# Patient Record
Sex: Male | Born: 1960 | Race: White | Hispanic: No | Marital: Married | Smoking: Former smoker
Health system: Southern US, Community
[De-identification: ages and names within clinical notes are randomized; demographics above are authoritative.]

## PROBLEM LIST (undated history)

## (undated) DIAGNOSIS — I10 Essential (primary) hypertension: Secondary | ICD-10-CM

## (undated) DIAGNOSIS — E119 Type 2 diabetes mellitus without complications: Secondary | ICD-10-CM

## (undated) DIAGNOSIS — E785 Hyperlipidemia, unspecified: Secondary | ICD-10-CM

## (undated) HISTORY — PX: HAND SURGERY: SHX662

## (undated) HISTORY — DX: Hyperlipidemia, unspecified: E78.5

---

## 1996-09-15 HISTORY — PX: HAND SURGERY: SHX662

## 2006-12-18 ENCOUNTER — Emergency Department (HOSPITAL_COMMUNITY): Admission: EM | Admit: 2006-12-18 | Discharge: 2006-12-18 | Payer: Self-pay | Admitting: Family Medicine

## 2013-06-13 ENCOUNTER — Encounter: Payer: Self-pay | Admitting: Family Medicine

## 2013-06-13 ENCOUNTER — Ambulatory Visit (INDEPENDENT_AMBULATORY_CARE_PROVIDER_SITE_OTHER): Payer: BC Managed Care – PPO | Admitting: Family Medicine

## 2013-06-13 VITALS — BP 127/87 | HR 66 | Temp 97.6°F | Ht 69.0 in | Wt 206.0 lb

## 2013-06-13 DIAGNOSIS — Z0000 Encounter for general adult medical examination without abnormal findings: Secondary | ICD-10-CM

## 2013-06-13 DIAGNOSIS — K5900 Constipation, unspecified: Secondary | ICD-10-CM

## 2013-06-13 DIAGNOSIS — R339 Retention of urine, unspecified: Secondary | ICD-10-CM

## 2013-06-13 DIAGNOSIS — N529 Male erectile dysfunction, unspecified: Secondary | ICD-10-CM

## 2013-06-13 LAB — POCT CBC
Granulocyte percent: 70.1 %G (ref 37–80)
HCT, POC: 49.9 % (ref 43.5–53.7)
Hemoglobin: 16.3 g/dL (ref 14.1–18.1)
Lymph, poc: 1.9 (ref 0.6–3.4)
MCH, POC: 28.6 pg (ref 27–31.2)
MCHC: 32.7 g/dL (ref 31.8–35.4)
MCV: 87.4 fL (ref 80–97)
MPV: 8.9 fL (ref 0–99.8)
POC Granulocyte: 5.7 (ref 2–6.9)
POC LYMPH PERCENT: 24 %L (ref 10–50)
Platelet Count, POC: 173 10*3/uL (ref 142–424)
RBC: 5.7 M/uL (ref 4.69–6.13)
RDW, POC: 13 %
WBC: 8.1 10*3/uL (ref 4.6–10.2)

## 2013-06-13 LAB — POCT UA - MICROSCOPIC ONLY
Casts, Ur, LPF, POC: NEGATIVE
Crystals, Ur, HPF, POC: NEGATIVE
Yeast, UA: NEGATIVE

## 2013-06-13 LAB — POCT URINALYSIS DIPSTICK
Bilirubin, UA: NEGATIVE
Glucose, UA: NEGATIVE
Ketones, UA: NEGATIVE
Leukocytes, UA: NEGATIVE
Nitrite, UA: NEGATIVE
Spec Grav, UA: >=1.03
Urobilinogen, UA: NEGATIVE
pH, UA: 5

## 2013-06-13 MED ORDER — TAMSULOSIN HCL 0.4 MG PO CAPS: 0.4000 mg | ORAL_CAPSULE | Freq: Every day | ORAL | Status: DC

## 2013-06-13 MED ORDER — POLYETHYLENE GLYCOL 3350 17 GM/SCOOP PO POWD: 17.0000 g | Freq: Two times a day (BID) | ORAL | Status: DC | PRN

## 2013-06-13 MED ORDER — TADALAFIL 20 MG PO TABS: 20.0000 mg | ORAL_TABLET | ORAL | Status: DC | PRN

## 2013-06-13 NOTE — Progress Notes (Signed)
  Subjective:    Patient ID: Currie Dennin, male    DOB: May 01, 1961, 52 y.o.   MRN: [id number]  HPI This 52 y.o. male presents for evaluation of having some bowel changes and constipation. He has been noticing the past few months he has to strain.  He has had to use stool Softeners now.  He has been having problems with diminished stream and difficulty getting Started with his urinary flow.  He has been experiencing some ED sx's.  He has not had CPE or labs in over 2 years.  He has not had colonoscopy.  He does not want flu shot.   Review of Systems C/o constipation, and urinary problems. No chest pain, SOB, HA, dizziness, vision change, N/V, diarrhea, myalgias, arthralgias or rash.     Objective:   Physical Exam Vital signs noted  Well developed well nourished male.  HEENT - Head atraumatic Normocephalic                Eyes - PERRLA, Conjuctiva - clear Sclera- Clear EOMI                Ears - EAC's Wnl TM's Wnl Gross Hearing WNL                Nose - Nares patent                 Throat - oropharanx wnl Respiratory - Lungs CTA bilateral Cardiac - RRR S1 and S2 without murmur GI - Abdomen soft Nontender and bowel sounds active x 4 Rectal - Rectal tone normal, prostate enlarged, smooth, and without masses, hemocult negative. Extremities - No edema. Neuro - Grossly intact.       Assessment & Plan:  Urinary retention - Plan: POCT UA - Microscopic Only, POCT urinalysis dipstick, tamsulosin (FLOMAX) 0.4 MG CAPS capsule  Routine general medical examination at a health care facility - Plan: POCT CBC, CMP14+EGFR, Lipid panel, PSA, total and free, Thyroid Panel With TSH, Ambulatory referral to Gastroenterology  Unspecified constipation - Plan: polyethylene glycol powder (GLYCOLAX/MIRALAX) powder, Ambulatory referral to Gastroenterology  Erectile dysfunction - Plan: tadalafil (CIALIS) 20 MG tablet.  Deatra Canter FNP

## 2013-06-13 NOTE — Patient Instructions (Addendum)
Smoking Cessation Quitting smoking is important to your health and has many advantages. However, it is not always easy to quit since nicotine is a very addictive drug. Often times, people try 3 times or more before being able to quit. This document explains the best ways for you to prepare to quit smoking. Quitting takes hard work and a lot of effort, but you can do it. ADVANTAGES OF QUITTING SMOKING  You will live longer, feel better, and live better.  Your body will feel the impact of quitting smoking almost immediately.  Within 20 minutes, blood pressure decreases. Your pulse returns to its normal level.  After 8 hours, carbon monoxide levels in the blood return to normal. Your oxygen level increases.  After 24 hours, the chance of having a heart attack starts to decrease. Your breath, hair, and body stop smelling like smoke.  After 48 hours, damaged nerve endings begin to recover. Your sense of taste and smell improve.  After 72 hours, the body is virtually free of nicotine. Your bronchial tubes relax and breathing becomes easier.  After 2 to 12 weeks, lungs can hold more air. Exercise becomes easier and circulation improves.  The risk of having a heart attack, stroke, cancer, or lung disease is greatly reduced.  After 1 year, the risk of coronary heart disease is cut in half.  After 5 years, the risk of stroke falls to the same as a nonsmoker.  After 10 years, the risk of lung cancer is cut in half and the risk of other cancers decreases significantly.  After 15 years, the risk of coronary heart disease drops, usually to the level of a nonsmoker.  If you are pregnant, quitting smoking will improve your chances of having a healthy baby.  The people you live with, especially any children, will be healthier.  You will have extra money to spend on things other than cigarettes. QUESTIONS TO THINK ABOUT BEFORE ATTEMPTING TO QUIT You may want to talk about your answers with your  caregiver.  Why do you want to quit?  If you tried to quit in the past, what helped and what did not?  What will be the most difficult situations for you after you quit? How will you plan to handle them?  Who can help you through the tough times? Your family? Friends? A caregiver?  What pleasures do you get from smoking? What ways can you still get pleasure if you quit? Here are some questions to ask your caregiver:  How can you help me to be successful at quitting?  What medicine do you think would be best for me and how should I take it?  What should I do if I need more help?  What is smoking withdrawal like? How can I get information on withdrawal? GET READY  Set a quit date.  Change your environment by getting rid of all cigarettes, ashtrays, matches, and lighters in your home, car, or work. Do not let people smoke in your home.  Review your past attempts to quit. Think about what worked and what did not. GET SUPPORT AND ENCOURAGEMENT You have a better chance of being successful if you have help. You can get support in many ways.  Tell your family, friends, and co-workers that you are going to quit and need their support. Ask them not to smoke around you.  Get individual, group, or telephone counseling and support. Programs are available at local hospitals and health centers. Call your local health department for   information about programs in your area.  Spiritual beliefs and practices may help some smokers quit.  Download a "quit meter" on your computer to keep track of quit statistics, such as how long you have gone without smoking, cigarettes not smoked, and money saved.  Get a self-help book about quitting smoking and staying off of tobacco. LEARN NEW SKILLS AND BEHAVIORS  Distract yourself from urges to smoke. Talk to someone, go for a walk, or occupy your time with a task.  Change your normal routine. Take a different route to work. Drink tea instead of coffee.  Eat breakfast in a different place.  Reduce your stress. Take a hot bath, exercise, or read a book.  Plan something enjoyable to do every day. Reward yourself for not smoking.  Explore interactive web-based programs that specialize in helping you quit. GET MEDICINE AND USE IT CORRECTLY Medicines can help you stop smoking and decrease the urge to smoke. Combining medicine with the above behavioral methods and support can greatly increase your chances of successfully quitting smoking.  Nicotine replacement therapy helps deliver nicotine to your body without the negative effects and risks of smoking. Nicotine replacement therapy includes nicotine gum, lozenges, inhalers, nasal sprays, and skin patches. Some may be available over-the-counter and others require a prescription.  Antidepressant medicine helps people abstain from smoking, but how this works is unknown. This medicine is available by prescription.  Nicotinic receptor partial agonist medicine simulates the effect of nicotine in your brain. This medicine is available by prescription. Ask your caregiver for advice about which medicines to use and how to use them based on your health history. Your caregiver will tell you what side effects to look out for if you choose to be on a medicine or therapy. Carefully read the information on the package. Do not use any other product containing nicotine while using a nicotine replacement product.  RELAPSE OR DIFFICULT SITUATIONS Most relapses occur within the first 3 months after quitting. Do not be discouraged if you start smoking again. Remember, most people try several times before finally quitting. You may have symptoms of withdrawal because your body is used to nicotine. You may crave cigarettes, be irritable, feel very hungry, cough often, get headaches, or have difficulty concentrating. The withdrawal symptoms are only temporary. They are strongest when you first quit, but they will go away within  10 14 days. To reduce the chances of relapse, try to:  Avoid drinking alcohol. Drinking lowers your chances of successfully quitting.  Reduce the amount of caffeine you consume. Once you quit smoking, the amount of caffeine in your body increases and can give you symptoms, such as a rapid heartbeat, sweating, and anxiety.  Avoid smokers because they can make you want to smoke.  Do not let weight gain distract you. Many smokers will gain weight when they quit, usually less than 10 pounds. Eat a healthy diet and stay active. You can always lose the weight gained after you quit.  Find ways to improve your mood other than smoking. FOR MORE INFORMATION  www.smokefree.gov  Document Released: 08/26/2001 Document Revised: 03/02/2012 Document Reviewed: 12/11/2011 [employer] Patient Information 2014 [employer].  

## 2013-06-14 LAB — CMP14+EGFR
ALT: 35 IU/L (ref 0–44)
AST: 17 IU/L (ref 0–40)
Albumin/Globulin Ratio: 2 (ref 1.1–2.5)
Albumin: 4.7 g/dL (ref 3.5–5.5)
Alkaline Phosphatase: 99 IU/L (ref 39–117)
BUN/Creatinine Ratio: 14 (ref 9–20)
BUN: 14 mg/dL (ref 6–24)
CO2: 21 mmol/L (ref 18–29)
Calcium: 9.6 mg/dL (ref 8.7–10.2)
Chloride: 102 mmol/L (ref 97–108)
Creatinine, Ser: 0.97 mg/dL (ref 0.76–1.27)
GFR calc Af Amer: 104 mL/min/{1.73_m2} (ref >59)
GFR calc non Af Amer: 90 mL/min/{1.73_m2} (ref >59)
Globulin, Total: 2.3 g/dL (ref 1.5–4.5)
Glucose: 102 mg/dL — ABNORMAL HIGH (ref 65–99)
Potassium: 4.2 mmol/L (ref 3.5–5.2)
Sodium: 144 mmol/L (ref 134–144)
Total Bilirubin: 0.8 mg/dL (ref 0.0–1.2)
Total Protein: 7 g/dL (ref 6.0–8.5)

## 2013-06-14 LAB — LIPID PANEL
Chol/HDL Ratio: 7 ratio units — ABNORMAL HIGH (ref 0.0–5.0)
Cholesterol, Total: 260 mg/dL — ABNORMAL HIGH (ref 100–199)
HDL: 37 mg/dL — ABNORMAL LOW (ref >39)
LDL Calculated: 150 mg/dL — ABNORMAL HIGH (ref 0–99)
Triglycerides: 367 mg/dL — ABNORMAL HIGH (ref 0–149)
VLDL Cholesterol Cal: 73 mg/dL — ABNORMAL HIGH (ref 5–40)

## 2013-06-14 LAB — PSA, TOTAL AND FREE
PSA, Free Pct: 38 %
PSA, Free: 0.38 ng/mL
PSA: 1 ng/mL (ref 0.0–4.0)

## 2013-06-14 LAB — THYROID PANEL WITH TSH
Free Thyroxine Index: 2 (ref 1.2–4.9)
T3 Uptake Ratio: 24 % (ref 24–39)
T4, Total: 8.2 ug/dL (ref 4.5–12.0)
TSH: 2.25 u[IU]/mL (ref 0.450–4.500)

## 2013-06-15 ENCOUNTER — Other Ambulatory Visit: Payer: Self-pay | Admitting: Family Medicine

## 2013-06-15 MED ORDER — CIPROFLOXACIN HCL 500 MG PO TABS: 500.0000 mg | ORAL_TABLET | Freq: Two times a day (BID) | ORAL | Status: DC

## 2013-06-15 MED ORDER — PRAVASTATIN SODIUM 20 MG PO TABS: 20.0000 mg | ORAL_TABLET | Freq: Every day | ORAL | Status: DC

## 2013-06-20 ENCOUNTER — Telehealth: Payer: Self-pay | Admitting: Family Medicine

## 2013-06-20 NOTE — Progress Notes (Signed)
Pt aware. To pick up meds.

## 2013-06-23 NOTE — Telephone Encounter (Signed)
PT WANTS A CHEAPER CHOLESTEROL MED CALLED TO [facility]. SAID THERE ARE $4 MEDS AT [facility]?

## 2013-06-24 ENCOUNTER — Other Ambulatory Visit: Payer: Self-pay | Admitting: Family Medicine

## 2013-06-24 NOTE — Telephone Encounter (Signed)
Try fish oil tablets otc and then repeat fasting lipid panel in 3 months.  DC the pravachol due to cost.

## 2013-06-27 NOTE — Telephone Encounter (Signed)
Patient notified

## 2013-07-21 ENCOUNTER — Encounter: Payer: Self-pay | Admitting: Family Medicine

## 2013-07-21 ENCOUNTER — Ambulatory Visit (INDEPENDENT_AMBULATORY_CARE_PROVIDER_SITE_OTHER): Payer: BC Managed Care – PPO | Admitting: Family Medicine

## 2013-07-21 VITALS — BP 148/93 | HR 82 | Temp 98.4°F | Ht 69.0 in | Wt 205.4 lb

## 2013-07-21 DIAGNOSIS — E785 Hyperlipidemia, unspecified: Secondary | ICD-10-CM

## 2013-07-21 DIAGNOSIS — J09X2 Influenza due to identified novel influenza A virus with other respiratory manifestations: Secondary | ICD-10-CM

## 2013-07-21 DIAGNOSIS — N529 Male erectile dysfunction, unspecified: Secondary | ICD-10-CM

## 2013-07-21 DIAGNOSIS — R6883 Chills (without fever): Secondary | ICD-10-CM

## 2013-07-21 LAB — POCT INFLUENZA A/B
Influenza A, POC: NEGATIVE
Influenza B, POC: POSITIVE

## 2013-07-21 MED ORDER — CIPROFLOXACIN HCL 500 MG PO TABS: 500.0000 mg | ORAL_TABLET | Freq: Two times a day (BID) | ORAL | Status: DC

## 2013-07-21 MED ORDER — SILDENAFIL CITRATE 100 MG PO TABS: 50.0000 mg | ORAL_TABLET | Freq: Every day | ORAL | Status: DC | PRN

## 2013-07-21 MED ORDER — LOVASTATIN 20 MG PO TABS: 20.0000 mg | ORAL_TABLET | Freq: Every day | ORAL | Status: DC

## 2013-07-21 NOTE — Progress Notes (Signed)
  Subjective:    Patient ID: Keith Zimmerman, male    DOB: 1961/05/23, 52 y.o.   MRN: [id number]  HPI This 52 y.o. male presents for evaluation of URI sx's, arthralgias and myalgias, Fever, sore throat, cough, congestion, and malaise.  He was not able to get the  Cipro, or pravastatin filled due to costs.  He quit taking flomax because it Was causing pain with ejaculation.  He still is having some difficulty with starting His voiding.  He has ED and couldn't afford cialis.     Review of Systems C/o pain with ejaculation and hesitency, URI, arthralgias and myalgias, malaise. No chest pain, SOB, HA, dizziness, vision change, N/V, diarrhea, constipation or rash.     Objective:   Physical Exam Vital signs noted  Well developed well nourished male.  HEENT - Head atraumatic Normocephalic                Eyes - PERRLA, Conjuctiva - clear Sclera- Clear EOMI                Ears - EAC's Wnl TM's Wnl Gross Hearing WNL                Throat - oropharanx wnl Respiratory - Lungs CTA bilateral Cardiac - RRR S1 and S2 without murmur GI - Abdomen soft Nontender and bowel sounds active x 4 Extremities - No edema. Neuro - Grossly intact.       Assessment & Plan:  Chills - Plan: POCT Influenza A/B, ciprofloxacin (CIPRO) 500 MG tablet Patient has the flu and he does not want tamiflu because he has had sx's for 5 days.  Prostatitis - Cipro 500mg  one po bid x 10 days with 2 refills  ED (erectile dysfunction) - Plan: sildenafil (VIAGRA) 100 MG tablet  Hyperlipidemia - Plan: lovastatin (MEVACOR) 20 MG tablet  Influenza due to identified novel influenza A virus with other respiratory manifestations  Deatra Canter FNP

## 2013-07-21 NOTE — Patient Instructions (Signed)

## 2013-08-15 ENCOUNTER — Encounter: Payer: Self-pay | Admitting: Family Medicine

## 2014-07-20 ENCOUNTER — Ambulatory Visit (INDEPENDENT_AMBULATORY_CARE_PROVIDER_SITE_OTHER): Payer: BC Managed Care – PPO | Admitting: Family Medicine

## 2014-07-20 ENCOUNTER — Ambulatory Visit (INDEPENDENT_AMBULATORY_CARE_PROVIDER_SITE_OTHER): Payer: BC Managed Care – PPO

## 2014-07-20 ENCOUNTER — Encounter (INDEPENDENT_AMBULATORY_CARE_PROVIDER_SITE_OTHER): Payer: Self-pay

## 2014-07-20 ENCOUNTER — Encounter: Payer: Self-pay | Admitting: Family Medicine

## 2014-07-20 VITALS — BP 113/73 | HR 72 | Temp 97.5°F | Ht 69.0 in | Wt 210.0 lb

## 2014-07-20 DIAGNOSIS — J209 Acute bronchitis, unspecified: Secondary | ICD-10-CM

## 2014-07-20 DIAGNOSIS — F17200 Nicotine dependence, unspecified, uncomplicated: Secondary | ICD-10-CM

## 2014-07-20 DIAGNOSIS — R05 Cough: Secondary | ICD-10-CM

## 2014-07-20 DIAGNOSIS — R059 Cough, unspecified: Secondary | ICD-10-CM

## 2014-07-20 DIAGNOSIS — Z720 Tobacco use: Secondary | ICD-10-CM

## 2014-07-20 LAB — POCT CBC
Granulocyte percent: 74.4 %G (ref 37–80)
HCT, POC: 46.6 % (ref 43.5–53.7)
Hemoglobin: 15.3 g/dL (ref 14.1–18.1)
LYMPH, POC: 2.2 (ref 0.6–3.4)
MCH, POC: 29 pg (ref 27–31.2)
MCHC: 32.9 g/dL (ref 31.8–35.4)
MCV: 88.1 fL (ref 80–97)
MPV: 8.6 fL (ref 0–99.8)
PLATELET COUNT, POC: 214 10*3/uL (ref 142–424)
POC Granulocyte: 8.1 — AB (ref 2–6.9)
POC LYMPH %: 19.8 % (ref 10–50)
RBC: 5.3 M/uL (ref 4.69–6.13)
RDW, POC: 13.7 %
WBC: 10.9 10*3/uL — AB (ref 4.6–10.2)

## 2014-07-20 MED ORDER — BUDESONIDE-FORMOTEROL FUMARATE 80-4.5 MCG/ACT IN AERO: INHALATION_SPRAY | RESPIRATORY_TRACT | Status: DC

## 2014-07-20 MED ORDER — PREDNISONE 10 MG PO TABS: ORAL_TABLET | ORAL | Status: DC

## 2014-07-20 MED ORDER — ALBUTEROL SULFATE HFA 108 (90 BASE) MCG/ACT IN AERS: INHALATION_SPRAY | RESPIRATORY_TRACT | Status: DC

## 2014-07-20 MED ORDER — AZITHROMYCIN 250 MG PO TABS: ORAL_TABLET | ORAL | Status: DC

## 2014-07-20 NOTE — Progress Notes (Signed)
Subjective:    Patient ID: Keith Zimmerman, male    DOB: 10/09/1960, 53 y.o.   MRN: [id number]  HPI Patient here today for chest congestion and cough, and tightness in chest for 3 days now. The patient says he was exposed to someone at work about 4 weeks ago and developed a sinus infection. He treated this on his own and the drainage was green colored a couple weeks ago and then he started coughing over the past 2-3 days. He denies fever and sore throat. He's never had a chest x-ray. He is a smoker. His pulse ox today was 97%.         There are no active problems to display for this patient.  Outpatient Encounter Prescriptions as of 07/20/2014  Medication Sig  . [DISCONTINUED] ciprofloxacin (CIPRO) 500 MG tablet Take 1 tablet (500 mg total) by mouth 2 (two) times daily.  . [DISCONTINUED] lovastatin (MEVACOR) 20 MG tablet Take 1 tablet (20 mg total) by mouth at bedtime.  . [DISCONTINUED] polyethylene glycol powder (GLYCOLAX/MIRALAX) powder Take 17 g by mouth 2 (two) times daily as needed.  . [DISCONTINUED] sildenafil (VIAGRA) 100 MG tablet Take 0.5-1 tablets (50-100 mg total) by mouth daily as needed for erectile dysfunction.  . [DISCONTINUED] tadalafil (CIALIS) 20 MG tablet Take 1 tablet (20 mg total) by mouth every other day as needed for erectile dysfunction.  . [DISCONTINUED] tamsulosin (FLOMAX) 0.4 MG CAPS capsule Take 1 capsule (0.4 mg total) by mouth daily.    Review of Systems  Constitutional: Negative.   HENT: Positive for congestion.   Eyes: Negative.   Respiratory: Positive for cough and chest tightness.   Cardiovascular: Negative.   Gastrointestinal: Negative.   Endocrine: Negative.   Genitourinary: Negative.   Musculoskeletal: Negative.   Skin: Negative.   Allergic/Immunologic: Negative.   Neurological: Negative.   Hematological: Negative.   Psychiatric/Behavioral: Negative.        Objective:   Physical Exam  Constitutional: He is oriented to person, place,  and time. He appears well-developed and well-nourished. No distress.  HENT:  Head: Normocephalic and atraumatic.  Right Ear: External ear normal.  Left Ear: External ear normal.  Mouth/Throat: Oropharynx is clear and moist. No oropharyngeal exudate.  Nasal congestion bilaterally  Eyes: Conjunctivae and EOM are normal. Pupils are equal, round, and reactive to light. Right eye exhibits no discharge. Left eye exhibits no discharge. No scleral icterus.  Neck: Normal range of motion. Neck supple. No thyromegaly present.  No anterior cervical nodes  Cardiovascular: Normal rate, regular rhythm and normal heart sounds.   No murmur heard. At 72/m  Pulmonary/Chest: Effort normal and breath sounds normal. No respiratory distress. He has no wheezes. He has no rales. He exhibits no tenderness.  Dry irritated cough, no wheezes, no rales  Abdominal: Soft. Bowel sounds are normal. He exhibits no mass. There is no tenderness. There is no rebound and no guarding.  Musculoskeletal: Normal range of motion. He exhibits no edema.  Lymphadenopathy:    He has no cervical adenopathy.  Neurological: He is alert and oriented to person, place, and time.  Skin: Skin is warm and dry. No rash noted. No pallor.  Psychiatric: He has a normal mood and affect. His behavior is normal. Judgment and thought content normal.  Nursing note and vitals reviewed.  BP 113/73 mmHg  Pulse 72  Temp(Src) 97.5 F (36.4 C) (Oral)  Ht 5\' 9"  (1.753 m)  Wt 210 lb (95.255 kg)  BMI 31.00 kg/m2  [facility]  reading (PRIMARY) by  Dr.Marlyn Rabine-chest x-ray-No active disease  Results for orders placed or performed in visit on 07/20/14  POCT CBC  Result Value Ref Range   WBC 10.9 (A) 4.6 - 10.2 K/uL   Lymph, poc 2.2 0.6 - 3.4   POC LYMPH PERCENT 19.8 10 - 50 %L   MID (cbc)  0 - 0.9   POC MID %  0 - 12 %M   POC Granulocyte 8.1 (A) 2 - 6.9   Granulocyte percent 74.4 37 - 80 %G   RBC 5.3 4.69 - 6.13 M/uL   Hemoglobin 15.3 14.1 - 18.1 g/dL   HCT,  POC 46.6 43.5 - 53.7 %   MCV 88.1 80 - 97 fL   MCH, POC 29.0 27 - 31.2 pg   MCHC 32.9 31.8 - 35.4 g/dL   RDW, POC 13.7 %   Platelet Count, POC 214.0 142 - 424 K/uL   MPV 8.6 0 - 99.8 fL   The patient was informed of the CBC result before you left the office.                                        Assessment & Plan:                                  1. Cough - POCT CBC - DG Chest 2 View; Future - budesonide-formoterol (SYMBICORT) 80-4.5 MCG/ACT inhaler; 2 puffs twice daily, rinse mouth after using  Dispense: 1 Inhaler; Refill: 3 - albuterol (PROVENTIL HFA;VENTOLIN HFA) 108 (90 BASE) MCG/ACT inhaler; Use 1 or 2 puffs every 6 hours as needed for a rescue inhaler and shortness of breath  Dispense: 1 Inhaler; Refill: 2  2. Smoker - DG Chest 2 View; Future - budesonide-formoterol (SYMBICORT) 80-4.5 MCG/ACT inhaler; 2 puffs twice daily, rinse mouth after using  Dispense: 1 Inhaler; Refill: 3  3. Acute bronchitis, unspecified organism - azithromycin (ZITHROMAX) 250 MG tablet; 2 pills the first day then 1 daily for infection until completed  Dispense: 6 tablet; Refill: 0  4. Bronchospasm with bronchitis, acute - budesonide-formoterol (SYMBICORT) 80-4.5 MCG/ACT inhaler; 2 puffs twice daily, rinse mouth after using  Dispense: 1 Inhaler; Refill: 3 - azithromycin (ZITHROMAX) 250 MG tablet; 2 pills the first day then 1 daily for infection until completed  Dispense: 6 tablet; Refill: 0 - albuterol (PROVENTIL HFA;VENTOLIN HFA) 108 (90 BASE) MCG/ACT inhaler; Use 1 or 2 puffs every 6 hours as needed for a rescue inhaler and shortness of breath  Dispense: 1 Inhaler; Refill: 2  Meds ordered this encounter  Medications  . budesonide-formoterol (SYMBICORT) 80-4.5 MCG/ACT inhaler    Sig: 2 puffs twice daily, rinse mouth after using    Dispense:  1 Inhaler    Refill:  3  . azithromycin (ZITHROMAX) 250 MG tablet    Sig: 2 pills the first day then 1 daily for infection until completed    Dispense:  6  tablet    Refill:  0  . albuterol (PROVENTIL HFA;VENTOLIN HFA) 108 (90 BASE) MCG/ACT inhaler    Sig: Use 1 or 2 puffs every 6 hours as needed for a rescue inhaler and shortness of breath    Dispense:  1 Inhaler    Refill:  2  . predniSONE (DELTASONE) 10 MG tablet    Sig: TAPER- 1 tab by  mouth four times daily for 2 days, 1 tab by mouth three times daily for 2 days, 1 tab by mouth twice a day for 2 days, then 1 tab by mouth daily for 2 days, then stop.    Dispense:  20 tablet    Refill:  0   Patient Instructions  Stop smoking Stop smoking, think about your son and plan to be here for him for years to come Drink plenty of fluids Take Mucinex maximum strength, blue and white in color, 1 twice daily with a large glass of water for cough and congestion Take antibiotic as directed Use inhaler, Symbicort 80/4.5     2 puffs twice daily and rinse mouth after using   Arrie Senate MD

## 2014-07-20 NOTE — Patient Instructions (Signed)
Stop smoking Stop smoking, think about your son and plan to be here for him for years to come Drink plenty of fluids Take Mucinex maximum strength, blue and white in color, 1 twice daily with a large glass of water for cough and congestion Take antibiotic as directed Use inhaler, Symbicort 80/4.5     2 puffs twice daily and rinse mouth after using

## 2014-08-01 ENCOUNTER — Encounter: Payer: BC Managed Care – PPO | Admitting: Family Medicine

## 2014-08-01 NOTE — Progress Notes (Signed)
This encounter was created in error - please disregard.

## 2015-04-10 ENCOUNTER — Encounter: Payer: Self-pay | Admitting: Family Medicine

## 2015-04-10 ENCOUNTER — Encounter (INDEPENDENT_AMBULATORY_CARE_PROVIDER_SITE_OTHER): Payer: Self-pay

## 2015-04-10 ENCOUNTER — Ambulatory Visit (INDEPENDENT_AMBULATORY_CARE_PROVIDER_SITE_OTHER): Payer: BLUE CROSS/BLUE SHIELD | Admitting: Family Medicine

## 2015-04-10 VITALS — BP 123/65 | HR 76 | Temp 97.5°F | Ht 69.0 in | Wt 209.0 lb

## 2015-04-10 DIAGNOSIS — Z1211 Encounter for screening for malignant neoplasm of colon: Secondary | ICD-10-CM

## 2015-04-10 DIAGNOSIS — Z0000 Encounter for general adult medical examination without abnormal findings: Secondary | ICD-10-CM | POA: Diagnosis not present

## 2015-04-10 NOTE — Patient Instructions (Signed)
You may receive a survey either by mail or email. Please take time to complete this as it helps [place] to serve you better.   Health Maintenance A healthy lifestyle and preventative care can promote health and wellness.  Maintain regular health, dental, and eye exams.  Eat a healthy diet. Foods like vegetables, fruits, whole grains, low-fat dairy products, and lean protein foods contain the nutrients you need and are low in calories. Decrease your intake of foods high in solid fats, added sugars, and salt. Get information about a proper diet from your health care provider, if necessary.  Regular physical exercise is one of the most important things you can do for your health. Most adults should get at least 150 minutes of moderate-intensity exercise (any activity that increases your heart rate and causes you to sweat) each week. In addition, most adults need muscle-strengthening exercises on 2 or more days a week.   Maintain a healthy weight. The body mass index (BMI) is a screening tool to identify possible weight problems. It provides an estimate of body fat based on height and weight. Your health care provider can find your BMI and can help you achieve or maintain a healthy weight. For males 20 years and older:  A BMI below 18.5 is considered underweight.  A BMI of 18.5 to 24.9 is normal.  A BMI of 25 to 29.9 is considered overweight.  A BMI of 30 and above is considered obese.  Maintain normal blood lipids and cholesterol by exercising and minimizing your intake of saturated fat. Eat a balanced diet with plenty of fruits and vegetables. Blood tests for lipids and cholesterol should begin at age 22 and be repeated every 5 years. If your lipid or cholesterol levels are high, you are over age 66, or you are at high risk for heart disease, you may need your cholesterol levels checked more frequently.Ongoing high lipid and cholesterol levels should be treated with medicines if diet and exercise  are not working.  If you smoke, find out from your health care provider how to quit. If you do not use tobacco, do not start.  Lung cancer screening is recommended for adults aged 80-80 years who are at high risk for developing lung cancer because of a history of smoking. A yearly low-dose CT scan of the lungs is recommended for people who have at least a 30-pack-year history of smoking and are current smokers or have quit within the past 15 years. A pack year of smoking is smoking an average of 1 pack of cigarettes a day for 1 year (for example, a 30-pack-year history of smoking could mean smoking 1 pack a day for 30 years or 2 packs a day for 15 years). Yearly screening should continue until the smoker has stopped smoking for at least 15 years. Yearly screening should be stopped for people who develop a health problem that would prevent them from having lung cancer treatment.  If you choose to drink alcohol, do not have more than 2 drinks per day. One drink is considered to be 12 oz (360 mL) of beer, 5 oz (150 mL) of wine, or 1.5 oz (45 mL) of liquor.  Avoid the use of street drugs. Do not share needles with anyone. Ask for help if you need support or instructions about stopping the use of drugs.  High blood pressure causes heart disease and increases the risk of stroke. Blood pressure should be checked at least every 1-2 years. Ongoing high blood pressure  should be treated with medicines if weight loss and exercise are not effective.  If you are 63-76 years old, ask your health care provider if you should take aspirin to prevent heart disease.  Diabetes screening involves taking a blood sample to check your fasting blood sugar level. This should be done once every 3 years after age 4 if you are at a normal weight and without risk factors for diabetes. Testing should be considered at a younger age or be carried out more frequently if you are overweight and have at least 1 risk factor for  diabetes.  Colorectal cancer can be detected and often prevented. Most routine colorectal cancer screening begins at the age of 68 and continues through age 16. However, your health care provider may recommend screening at an earlier age if you have risk factors for colon cancer. On a yearly basis, your health care provider may provide home test kits to check for hidden blood in the stool. A small camera at the end of a tube may be used to directly examine the colon (sigmoidoscopy or colonoscopy) to detect the earliest forms of colorectal cancer. Talk to your health care provider about this at age 47 when routine screening begins. A direct exam of the colon should be repeated every 5-10 years through age 55, unless early forms of precancerous polyps or small growths are found.  People who are at an increased risk for hepatitis B should be screened for this virus. You are considered at high risk for hepatitis B if:  You were born in a country where hepatitis B occurs often. Talk with your health care provider about which countries are considered high risk.  Your parents were born in a high-risk country and you have not received a shot to protect against hepatitis B (hepatitis B vaccine).  You have HIV or AIDS.  You use needles to inject street drugs.  You live with, or have sex with, someone who has hepatitis B.  You are a man who has sex with other men (MSM).  You get hemodialysis treatment.  You take certain medicines for conditions like cancer, organ transplantation, and autoimmune conditions.  Hepatitis C blood testing is recommended for all people born from [birth year] through 1964 and any individual with known risk factors for hepatitis C.  Healthy men should no longer receive prostate-specific antigen (PSA) blood tests as part of routine cancer screening. Talk to your health care provider about prostate cancer screening.  Testicular cancer screening is not recommended for adolescents or  adult males who have no symptoms. Screening includes self-exam, a health care provider exam, and other screening tests. Consult with your health care provider about any symptoms you have or any concerns you have about testicular cancer.  Practice safe sex. Use condoms and avoid high-risk sexual practices to reduce the spread of sexually transmitted infections (STIs).  You should be screened for STIs, including gonorrhea and chlamydia if:  You are sexually active and are younger than 24 years.  You are older than 24 years, and your health care provider tells you that you are at risk for this type of infection.  Your sexual activity has changed since you were last screened, and you are at an increased risk for chlamydia or gonorrhea. Ask your health care provider if you are at risk.  If you are at risk of being infected with HIV, it is recommended that you take a prescription medicine daily to prevent HIV infection. This is called pre-exposure prophylaxis (  PrEP). You are considered at risk if:  You are a man who has sex with other men (MSM).  You are a heterosexual man who is sexually active with multiple partners.  You take drugs by injection.  You are sexually active with a partner who has HIV.  Talk with your health care provider about whether you are at high risk of being infected with HIV. If you choose to begin PrEP, you should first be tested for HIV. You should then be tested every 3 months for as long as you are taking PrEP.  Use sunscreen. Apply sunscreen liberally and repeatedly throughout the day. You should seek shade when your shadow is shorter than you. Protect yourself by wearing long sleeves, pants, a wide-brimmed hat, and sunglasses year round whenever you are outdoors.  Tell your health care provider of new moles or changes in moles, especially if there is a change in shape or color. Also, tell your health care provider if a mole is larger than the size of a pencil  eraser.  A one-time screening for abdominal aortic aneurysm (AAA) and surgical repair of large AAAs by ultrasound is recommended for men aged 42-75 years who are current or former smokers.  Stay current with your vaccines (immunizations). Document Released: 02/28/2008 Document Revised: 09/06/2013 Document Reviewed: 01/27/2011 [facility] Patient Information 2015 [place], [place]. This information is not intended to replace advice given to you by your health care provider. Make sure you discuss any questions you have with your health care provider.

## 2015-04-10 NOTE — Progress Notes (Signed)
   Subjective:    Patient ID: Keith Zimmerman, male    DOB: 1961/05/26, 54 y.o.   MRN: [id number]  HPI  54 year old gentleman here for a physical. He denies any symptoms or problems. He was given Mevacor in the past but he never got the prescription filled. Reviewing his lipid numbers from almost 2 years ago LDL was in a gray zone the triglycerides were elevated at 367. He also tried Viagra and Cialis without much success.  There is no family history of heart disease cancer stroke. Patient does smoke about one fourth pack per day and is in the process of waiting.  There are no active problems to display for this patient.  Outpatient Encounter Prescriptions as of 04/10/2015  Medication Sig  . [DISCONTINUED] albuterol (PROVENTIL HFA;VENTOLIN HFA) 108 (90 BASE) MCG/ACT inhaler Use 1 or 2 puffs every 6 hours as needed for a rescue inhaler and shortness of breath  . [DISCONTINUED] azithromycin (ZITHROMAX) 250 MG tablet 2 pills the first day then 1 daily for infection until completed  . [DISCONTINUED] budesonide-formoterol (SYMBICORT) 80-4.5 MCG/ACT inhaler 2 puffs twice daily, rinse mouth after using  . [DISCONTINUED] predniSONE (DELTASONE) 10 MG tablet TAPER- 1 tab by mouth four times daily for 2 days, 1 tab by mouth three times daily for 2 days, 1 tab by mouth twice a day for 2 days, then 1 tab by mouth daily for 2 days, then stop.   No facility-administered encounter medications on file as of 04/10/2015.      Review of Systems  Constitutional: Negative.   HENT: Negative.   Eyes: Negative.   Respiratory: Negative.  Negative for shortness of breath.   Cardiovascular: Negative.  Negative for chest pain and leg swelling.  Gastrointestinal: Negative.   Genitourinary: Negative.   Musculoskeletal: Negative.   Skin: Negative.   Neurological: Negative.   Psychiatric/Behavioral: Negative.   All other systems reviewed and are negative.      Objective:   Physical Exam  Constitutional: He is  oriented to person, place, and time. He appears well-developed and well-nourished.  HENT:  Head: Normocephalic.  Right Ear: External ear normal.  Left Ear: External ear normal.  Nose: Nose normal.  Mouth/Throat: Oropharynx is clear and moist.  Eyes: Conjunctivae and EOM are normal. Pupils are equal, round, and reactive to light.  Neck: Normal range of motion. Neck supple.  Cardiovascular: Normal rate, regular rhythm, normal heart sounds and intact distal pulses.   Pulmonary/Chest: Effort normal and breath sounds normal.  Abdominal: Soft. Bowel sounds are normal.  Genitourinary: Prostate normal.  Musculoskeletal: Normal range of motion.  Neurological: He is alert and oriented to person, place, and time.  Skin: Skin is warm and dry.  Psychiatric: He has a normal mood and affect. His behavior is normal. Judgment and thought content normal.     BP 123/65 mmHg  Pulse 76  Temp(Src) 97.5 F (36.4 C) (Oral)  Ht $R'5\' 9"'bq$  (1.753 m)  Wt 209 lb (94.802 kg)  BMI 30.85 kg/m2      Assessment & Plan:  1. Encounter for screening colonoscopy Asymptomatic but screening exam - Ambulatory referral to Gastroenterology  2. Annual physical exam Need to repeat lipids and blood sugar which was in the prediabetic range. Encouraged stop smoking and will have further discussions regarding need for treatment of lipids - Lipid panel; Future - PSA, total and free; Future  Wardell Honour MD - [phone]; Future

## 2015-04-11 ENCOUNTER — Other Ambulatory Visit (INDEPENDENT_AMBULATORY_CARE_PROVIDER_SITE_OTHER): Payer: BLUE CROSS/BLUE SHIELD

## 2015-04-11 DIAGNOSIS — Z0000 Encounter for general adult medical examination without abnormal findings: Secondary | ICD-10-CM | POA: Diagnosis not present

## 2015-04-11 NOTE — Progress Notes (Signed)
Lab only 

## 2015-04-12 LAB — CMP14+EGFR
A/G RATIO: 2.4 (ref 1.1–2.5)
ALK PHOS: 98 IU/L (ref 39–117)
ALT: 27 IU/L (ref 0–44)
AST: 14 IU/L (ref 0–40)
Albumin: 4.5 g/dL (ref 3.5–5.5)
BILIRUBIN TOTAL: 0.8 mg/dL (ref 0.0–1.2)
BUN/Creatinine Ratio: 19 (ref 9–20)
BUN: 21 mg/dL (ref 6–24)
CHLORIDE: 102 mmol/L (ref 97–108)
CO2: 21 mmol/L (ref 18–29)
Calcium: 9.1 mg/dL (ref 8.7–10.2)
Creatinine, Ser: 1.09 mg/dL (ref 0.76–1.27)
GFR calc Af Amer: 90 mL/min/{1.73_m2} (ref >59)
GFR, EST NON AFRICAN AMERICAN: 78 mL/min/{1.73_m2} (ref >59)
GLUCOSE: 110 mg/dL — AB (ref 65–99)
Globulin, Total: 1.9 g/dL (ref 1.5–4.5)
POTASSIUM: 4.2 mmol/L (ref 3.5–5.2)
SODIUM: 141 mmol/L (ref 134–144)
Total Protein: 6.4 g/dL (ref 6.0–8.5)

## 2015-04-12 LAB — LIPID PANEL
CHOL/HDL RATIO: 7.3 ratio — AB (ref 0.0–5.0)
CHOLESTEROL TOTAL: 247 mg/dL — AB (ref 100–199)
HDL: 34 mg/dL — ABNORMAL LOW (ref >39)
LDL CALC: 152 mg/dL — AB (ref 0–99)
Triglycerides: 303 mg/dL — ABNORMAL HIGH (ref 0–149)
VLDL CHOLESTEROL CAL: 61 mg/dL — AB (ref 5–40)

## 2015-04-12 LAB — PSA, TOTAL AND FREE
PSA, Free Pct: 31.8 %
PSA, Free: 0.35 ng/mL
Prostate Specific Ag, Serum: 1.1 ng/mL (ref 0.0–4.0)

## 2015-04-18 ENCOUNTER — Encounter: Payer: Self-pay | Admitting: Internal Medicine

## 2015-06-13 ENCOUNTER — Ambulatory Visit (AMBULATORY_SURGERY_CENTER): Payer: BLUE CROSS/BLUE SHIELD | Admitting: *Deleted

## 2015-06-13 VITALS — Ht 71.0 in | Wt 208.0 lb

## 2015-06-13 DIAGNOSIS — Z1211 Encounter for screening for malignant neoplasm of colon: Secondary | ICD-10-CM

## 2015-06-13 NOTE — Progress Notes (Signed)
Patient wife at side during pv. Patient denies any allergies to eggs or soy. Patient denies any problems with anesthesia/sedation. Patient denies any oxygen use at home and does not take any diet/weight loss medications. Patient declined EMMI education.

## 2015-06-26 ENCOUNTER — Telehealth: Payer: Self-pay | Admitting: Internal Medicine

## 2015-06-26 ENCOUNTER — Encounter: Payer: Self-pay | Admitting: Internal Medicine

## 2015-06-26 NOTE — Telephone Encounter (Signed)
I spoke with Dr. Carlean Purl and he advised patient to not have procedure considering that he is feeling so poorly.  Dr. Carlean Purl states that he will not charge patient.  I spoke with patient and told him the above.  He states that he will call back to reschedule procedure.  All questions were answered.

## 2015-06-26 NOTE — Telephone Encounter (Signed)
Called patient and he has been complaining of chills, body aches and night sweats with headache, bad cough. and sore throat.  He drank the first half of his prep yesterday evening but has not started the second half as of yet.  He has no thermometer to give [place] a reading.  I advised him I would discuss this with Dr. Carlean Purl and I would call him back.  I advised him not to start second half until he hears back from me.

## 2015-06-27 ENCOUNTER — Encounter: Payer: Self-pay | Admitting: Internal Medicine

## 2016-06-04 ENCOUNTER — Encounter (HOSPITAL_COMMUNITY): Payer: Self-pay

## 2016-06-04 ENCOUNTER — Emergency Department (HOSPITAL_COMMUNITY)
Admission: EM | Admit: 2016-06-04 | Discharge: 2016-06-04 | Disposition: A | Discharge status: Home / Self Care | Payer: BLUE CROSS/BLUE SHIELD | Attending: Dermatology | Admitting: Dermatology

## 2016-06-04 DIAGNOSIS — M79602 Pain in left arm: Secondary | ICD-10-CM | POA: Diagnosis not present

## 2016-06-04 DIAGNOSIS — Z5321 Procedure and treatment not carried out due to patient leaving prior to being seen by health care provider: Secondary | ICD-10-CM | POA: Diagnosis not present

## 2016-06-04 DIAGNOSIS — M79605 Pain in left leg: Secondary | ICD-10-CM | POA: Insufficient documentation

## 2016-06-04 DIAGNOSIS — M79601 Pain in right arm: Secondary | ICD-10-CM | POA: Insufficient documentation

## 2016-06-04 DIAGNOSIS — F17210 Nicotine dependence, cigarettes, uncomplicated: Secondary | ICD-10-CM | POA: Insufficient documentation

## 2016-06-04 DIAGNOSIS — M79604 Pain in right leg: Secondary | ICD-10-CM | POA: Diagnosis not present

## 2016-06-04 NOTE — ED Triage Notes (Signed)
C/o bil upper and lower extremity pain. Reports sensation as "tingling" reports symptoms have been over past month.

## 2017-09-30 ENCOUNTER — Encounter: Payer: Self-pay | Admitting: Family Medicine

## 2017-09-30 ENCOUNTER — Ambulatory Visit (INDEPENDENT_AMBULATORY_CARE_PROVIDER_SITE_OTHER): Payer: BLUE CROSS/BLUE SHIELD | Admitting: Family Medicine

## 2017-09-30 VITALS — BP 131/77 | HR 84 | Temp 98.0°F | Ht 71.0 in | Wt 218.0 lb

## 2017-09-30 DIAGNOSIS — Z0000 Encounter for general adult medical examination without abnormal findings: Secondary | ICD-10-CM | POA: Diagnosis not present

## 2017-09-30 DIAGNOSIS — Z131 Encounter for screening for diabetes mellitus: Secondary | ICD-10-CM

## 2017-09-30 DIAGNOSIS — Z1159 Encounter for screening for other viral diseases: Secondary | ICD-10-CM

## 2017-09-30 DIAGNOSIS — Z13220 Encounter for screening for lipoid disorders: Secondary | ICD-10-CM

## 2017-09-30 DIAGNOSIS — Z1211 Encounter for screening for malignant neoplasm of colon: Secondary | ICD-10-CM

## 2017-09-30 NOTE — Progress Notes (Signed)
BP 131/77   Pulse 84   Temp 98 F (36.7 C) (Oral)   Ht '5\' 11"'  (1.803 m)   Wt 218 lb (98.9 kg)   BMI 30.40 kg/m    Subjective:    Patient ID: Keith Zimmerman, male    DOB: 05-20-1961, 57 y.o.   MRN: [id number]  HPI: Keith Zimmerman is a 57 y.o. male presenting on 09/30/2017 for Annual Exam   HPI Adult well exam Patient is coming in today for an adult well exam and physical.  He denies any major health issues or health concerns.  He would also like to get his colonoscopy and hepatitis C screening.  He denies any urinary or prostate issues. Patient denies any chest pain, shortness of breath, headaches or vision issues, abdominal complaints, diarrhea, nausea, vomiting, or joint issues.   Relevant past medical, surgical, family and social history reviewed and updated as indicated. Interim medical history since our last visit reviewed. Allergies and medications reviewed and updated.  Review of Systems  Constitutional: Negative for chills and fever.  HENT: Negative for ear pain and tinnitus.   Eyes: Negative for pain and discharge.  Respiratory: Negative for cough, shortness of breath and wheezing.   Cardiovascular: Negative for chest pain, palpitations and leg swelling.  Gastrointestinal: Negative for abdominal pain, blood in stool, constipation and diarrhea.  Genitourinary: Negative for dysuria and hematuria.  Musculoskeletal: Negative for back pain, gait problem and myalgias.  Skin: Negative for rash.  Neurological: Negative for dizziness, weakness, numbness and headaches.  Psychiatric/Behavioral: Negative for suicidal ideas.  All other systems reviewed and are negative.   Per HPI unless specifically indicated above     Objective:    BP 131/77   Pulse 84   Temp 98 F (36.7 C) (Oral)   Ht '5\' 11"'  (1.803 m)   Wt 218 lb (98.9 kg)   BMI 30.40 kg/m   Wt Readings from Last 3 Encounters:  09/30/17 218 lb (98.9 kg)  06/13/15 208 lb (94.3 kg)  04/10/15 209 lb (94.8 kg)      Physical Exam  Constitutional: He is oriented to person, place, and time. He appears well-developed and well-nourished. No distress.  HENT:  Right Ear: External ear normal.  Left Ear: External ear normal.  Nose: Nose normal.  Mouth/Throat: Oropharynx is clear and moist. No oropharyngeal exudate.  Eyes: Conjunctivae and EOM are normal. Pupils are equal, round, and reactive to light. No scleral icterus.  Neck: Neck supple. No thyromegaly present.  Cardiovascular: Normal rate, regular rhythm, normal heart sounds and intact distal pulses.  No murmur heard. Pulmonary/Chest: Effort normal and breath sounds normal. No respiratory distress. He has no wheezes.  Abdominal: Soft. Bowel sounds are normal. He exhibits no distension. There is no tenderness. There is no rebound and no guarding.  Musculoskeletal: Normal range of motion. He exhibits no edema.  Lymphadenopathy:    He has no cervical adenopathy.  Neurological: He is alert and oriented to person, place, and time. Coordination normal.  Skin: Skin is warm and dry. No rash noted. He is not diaphoretic.  Psychiatric: He has a normal mood and affect. His behavior is normal.  Nursing note and vitals reviewed.       Assessment & Plan:   Problem List Items Addressed This Visit    None    Visit Diagnoses    Well adult exam    -  Primary   Relevant Orders   CBC with Differential/Platelet   CMP14+EGFR   Lipid  panel   Ambulatory referral to Gastroenterology   Colon cancer screening       Relevant Orders   Ambulatory referral to Gastroenterology   Lipid screening       Relevant Orders   Lipid panel   Diabetes mellitus screening       Relevant Orders   CMP14+EGFR   Need for hepatitis C screening test       Relevant Orders   Hepatitis C antibody       Follow up plan: Return in about 1 year (around 09/30/2018), or if symptoms worsen or fail to improve.  Counseling provided for all of the vaccine components Orders Placed This  Encounter  Procedures  . CBC with Differential/Platelet  . CMP14+EGFR  . Lipid panel  . Ambulatory referral to Gastroenterology    Caryl Pina, MD [facility] 09/30/2017, 3:26 PM

## 2017-10-01 LAB — CMP14+EGFR
A/G RATIO: 2.3 — AB (ref 1.2–2.2)
ALBUMIN: 4.8 g/dL (ref 3.5–5.5)
ALK PHOS: 94 IU/L (ref 39–117)
ALT: 39 IU/L (ref 0–44)
AST: 19 IU/L (ref 0–40)
BILIRUBIN TOTAL: 0.7 mg/dL (ref 0.0–1.2)
BUN / CREAT RATIO: 16 (ref 9–20)
BUN: 19 mg/dL (ref 6–24)
CHLORIDE: 103 mmol/L (ref 96–106)
CO2: 21 mmol/L (ref 20–29)
Calcium: 9.7 mg/dL (ref 8.7–10.2)
Creatinine, Ser: 1.17 mg/dL (ref 0.76–1.27)
GFR calc Af Amer: 81 mL/min/{1.73_m2} (ref >59)
GFR calc non Af Amer: 70 mL/min/{1.73_m2} (ref >59)
GLOBULIN, TOTAL: 2.1 g/dL (ref 1.5–4.5)
Glucose: 101 mg/dL — ABNORMAL HIGH (ref 65–99)
POTASSIUM: 4 mmol/L (ref 3.5–5.2)
SODIUM: 144 mmol/L (ref 134–144)
Total Protein: 6.9 g/dL (ref 6.0–8.5)

## 2017-10-01 LAB — CBC WITH DIFFERENTIAL/PLATELET
BASOS ABS: 0.1 10*3/uL (ref 0.0–0.2)
Basos: 1 %
EOS (ABSOLUTE): 0.2 10*3/uL (ref 0.0–0.4)
Eos: 2 %
HEMATOCRIT: 45.5 % (ref 37.5–51.0)
HEMOGLOBIN: 15.5 g/dL (ref 13.0–17.7)
Immature Grans (Abs): 0 10*3/uL (ref 0.0–0.1)
Immature Granulocytes: 0 %
LYMPHS ABS: 2.2 10*3/uL (ref 0.7–3.1)
Lymphs: 22 %
MCH: 30.4 pg (ref 26.6–33.0)
MCHC: 34.1 g/dL (ref 31.5–35.7)
MCV: 89 fL (ref 79–97)
MONOCYTES: 8 %
Monocytes Absolute: 0.8 10*3/uL (ref 0.1–0.9)
NEUTROS ABS: 6.6 10*3/uL (ref 1.4–7.0)
Neutrophils: 67 %
Platelets: 205 10*3/uL (ref 150–379)
RBC: 5.1 x10E6/uL (ref 4.14–5.80)
RDW: 13.6 % (ref 12.3–15.4)
WBC: 9.8 10*3/uL (ref 3.4–10.8)

## 2017-10-01 LAB — LIPID PANEL
CHOLESTEROL TOTAL: 259 mg/dL — AB (ref 100–199)
Chol/HDL Ratio: 7 ratio — ABNORMAL HIGH (ref 0.0–5.0)
HDL: 37 mg/dL — ABNORMAL LOW (ref >39)
LDL Calculated: 148 mg/dL — ABNORMAL HIGH (ref 0–99)
Triglycerides: 371 mg/dL — ABNORMAL HIGH (ref 0–149)
VLDL Cholesterol Cal: 74 mg/dL — ABNORMAL HIGH (ref 5–40)

## 2017-10-01 LAB — HEPATITIS C ANTIBODY

## 2017-11-30 ENCOUNTER — Encounter: Payer: Self-pay | Admitting: Family Medicine

## 2017-12-24 ENCOUNTER — Emergency Department (HOSPITAL_COMMUNITY): Payer: BLUE CROSS/BLUE SHIELD

## 2017-12-24 ENCOUNTER — Other Ambulatory Visit: Payer: Self-pay

## 2017-12-24 ENCOUNTER — Emergency Department (HOSPITAL_COMMUNITY)
Admission: EM | Admit: 2017-12-24 | Discharge: 2017-12-24 | Disposition: A | Discharge status: Home / Self Care | Payer: BLUE CROSS/BLUE SHIELD | Attending: Emergency Medicine | Admitting: Emergency Medicine

## 2017-12-24 ENCOUNTER — Encounter (HOSPITAL_COMMUNITY): Payer: Self-pay

## 2017-12-24 DIAGNOSIS — R0602 Shortness of breath: Secondary | ICD-10-CM | POA: Insufficient documentation

## 2017-12-24 DIAGNOSIS — F17210 Nicotine dependence, cigarettes, uncomplicated: Secondary | ICD-10-CM | POA: Insufficient documentation

## 2017-12-24 DIAGNOSIS — R0789 Other chest pain: Secondary | ICD-10-CM | POA: Insufficient documentation

## 2017-12-24 DIAGNOSIS — R079 Chest pain, unspecified: Secondary | ICD-10-CM | POA: Diagnosis not present

## 2017-12-24 DIAGNOSIS — R202 Paresthesia of skin: Secondary | ICD-10-CM | POA: Diagnosis not present

## 2017-12-24 LAB — CBC
HEMATOCRIT: 45.2 % (ref 39.0–52.0)
Hemoglobin: 15.3 g/dL (ref 13.0–17.0)
MCH: 30.8 pg (ref 26.0–34.0)
MCHC: 33.8 g/dL (ref 30.0–36.0)
MCV: 91.1 fL (ref 78.0–100.0)
Platelets: 203 10*3/uL (ref 150–400)
RBC: 4.96 MIL/uL (ref 4.22–5.81)
RDW: 13 % (ref 11.5–15.5)
WBC: 12.8 10*3/uL — AB (ref 4.0–10.5)

## 2017-12-24 LAB — BASIC METABOLIC PANEL
ANION GAP: 10 (ref 5–15)
BUN: 27 mg/dL — AB (ref 6–20)
CO2: 23 mmol/L (ref 22–32)
Calcium: 9.5 mg/dL (ref 8.9–10.3)
Chloride: 109 mmol/L (ref 101–111)
Creatinine, Ser: 1.2 mg/dL (ref 0.61–1.24)
GFR calc Af Amer: >60 mL/min (ref >60)
Glucose, Bld: 96 mg/dL (ref 65–99)
POTASSIUM: 3.8 mmol/L (ref 3.5–5.1)
SODIUM: 142 mmol/L (ref 135–145)

## 2017-12-24 LAB — I-STAT TROPONIN, ED
Troponin i, poc: 0 ng/mL (ref 0.00–0.08)
Troponin i, poc: 0 ng/mL (ref 0.00–0.08)

## 2017-12-24 LAB — BRAIN NATRIURETIC PEPTIDE: B NATRIURETIC PEPTIDE 5: 7.4 pg/mL (ref 0.0–100.0)

## 2017-12-24 NOTE — ED Triage Notes (Signed)
Pt reports that his L chest began hurting around 4p today while he was at work. He states that the pain felt like pressure and it caused him to be SOB and lightheaded. Also reports L arm tingling. He states that the pain has improved. A&Ox4. Ambulatory.

## 2017-12-24 NOTE — ED Notes (Signed)
Lab called to add on BNP 

## 2017-12-24 NOTE — Discharge Instructions (Addendum)
Today you were seen in the emergency department for chest pain.  You will need to follow up with the cardiologist as an outpatient, you were given a referral to the cardiology office and you will need to contact the office to make an appointment.  You also need to schedule appointment with your primary care doctor for reevaluation within 1 week.  You will need to return to the emergency department for any chest pain, shortness of breath, or any new or worsening symptoms.

## 2017-12-24 NOTE — ED Provider Notes (Signed)
[facility] DEPT Provider Note   CSN: [id number] Arrival date & time: 12/24/17  1759     History   Chief Complaint Chief Complaint  Patient presents with  . Chest Pain    HPI Keith Zimmerman is a 57 y.o. male.  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  Rates pain 8/10, states that it radiated from the left side of his chest underneath the left part of his axilla and down his left arm. Patient states that pain "felt like when something gets caught in your throat".  States that he sat down an episode lasted for about 30 minutes.  He then stood up and the pain returned again.  States that he had about 3 episodes of this today.  States that all episodes were associated with shortness of breath.  Denies associated diaphoresis, lightheadedness, dizziness, nausea, vomiting.  He denies any recent URI symptoms.  Denies any fevers, abdominal pain, diarrhea or constipation.  He has never had symptoms like this before.  He took aspirin prior to arrival, states that he took 2 pills of what he thinks was 300 mg aspirin.  States that his chest pain is currently resolved.   Reports paresthesias to the left hand that have persisted since onset.  States that he has had paresthesias in the left hand in the past and that this is somewhat chronic.   Denies leg pain/swelling, hemoptysis, recent surgery/trauma, recent long travel, hormone use, personal hx of cancer, or hx of DVT/PE.   History reviewed. No pertinent past medical history.  There are no active problems to display for this patient.   Past Surgical History:  Procedure Laterality Date  . HAND SURGERY          Home Medications    Prior to Admission medications   Not on File      Family History Family History  Problem Relation Age of Onset  . COPD Father   . Colon cancer Neg Hx     Social History Social History   Tobacco Use  . Smoking status: Current Every Day Smoker    Packs/day: 0.50    Years: 31.00    Pack years: 15.50    Types: Cigarettes  . Smokeless tobacco: Never Used  Substance Use Topics  . Alcohol use: No  . Drug use: No     Allergies   Rhododendron   Review of Systems Review of Systems  Constitutional: Negative for fever.  HENT: Negative for congestion, rhinorrhea and sore throat.   Eyes: Negative for visual disturbance.  Respiratory: Positive for shortness of breath. Negative for cough.   Cardiovascular: Positive for chest pain. Negative for palpitations and leg swelling.  Gastrointestinal: Negative for abdominal pain, constipation, diarrhea, nausea and vomiting.  Genitourinary: Negative for flank pain and frequency.  Musculoskeletal: Negative for back pain.  Skin: Negative for rash.  Neurological: Negative for dizziness, weakness, light-headedness, numbness and headaches.       Paresthesias to left hand     Physical Exam Updated Vital Signs BP 135/89   Pulse 71   Temp 98.1 F (36.7 C) (Oral)   Resp 15   Ht 5\' 11"  (1.803 m)   Wt 99.8 kg (220 lb)   SpO2 96%  BMI 30.68 kg/m   Physical Exam  Constitutional: He appears well-developed and well-nourished.  Non-toxic appearance. He does not appear ill. No distress.  HENT:  Head: Normocephalic and atraumatic.  Eyes: Conjunctivae are normal.  Neck: Neck supple.  Cardiovascular: Normal rate and regular rhythm.  No murmur heard. Pulses:      Radial pulses are 1+ on the right side, and 2+ on the left side.       Dorsalis pedis pulses are 1+ on the right side, and 2+ on the left side.  Pulmonary/Chest: Effort normal. No tachypnea. No respiratory distress.  Crackles to bilateral bases, no wheezing noted  Abdominal: Soft. Bowel sounds are normal. There is no  tenderness. There is no rebound and no guarding.  Musculoskeletal: He exhibits no edema.  No calf TTP, erythema, swelling.  Neurological: He is alert.  Skin: Skin is warm and dry.  Psychiatric: He has a normal mood and affect.  Nursing note and vitals reviewed.    ED Treatments / Results  Labs (all labs ordered are listed, but only abnormal results are displayed) Labs Reviewed  BASIC METABOLIC PANEL - Abnormal; Notable for the following components:      Result Value   BUN 27 (*)    All other components within normal limits  CBC - Abnormal; Notable for the following components:   WBC 12.8 (*)    All other components within normal limits  BRAIN NATRIURETIC PEPTIDE  I-STAT TROPONIN, ED  I-STAT TROPONIN, ED    EKG EKG Interpretation  Date/Time:  Thursday December 24 2017 18:15:29 EDT Ventricular Rate:  78 PR Interval:    QRS Duration: 100 QT Interval:  383 QTC Calculation: 437 R Axis:   -32 Text Interpretation:  Sinus rhythm Left axis deviation RSR' in V1 or V2, probably normal variant No significant change since last tracing Abnormal ekg Confirmed by Carmin Muskrat [phone]) on 12/24/2017 8:20:11 PM   Radiology Dg Chest 2 View  Result Date: 12/24/2017 CLINICAL DATA:  Left-sided chest pain today EXAM: CHEST - 2 VIEW COMPARISON:  07/20/2014 FINDINGS: Stable cardiomegaly. No aortic aneurysm. Mild central vascular congestion with bibasilar atelectasis. No pulmonary consolidation or pneumothorax. No effusion. No acute osseous abnormality. Osteoarthritis of the [facility] and glenohumeral joints. Mild degenerative changes are seen along the dorsal spine without acute osseous abnormality. IMPRESSION: Stable cardiomegaly with mild central vascular congestion and atelectasis. No acute pulmonary consolidation, effusion or pneumothorax. Electronically Signed   By: Ashley Royalty M.D.   On: 12/24/2017 19:09    Procedures Procedures (including critical care time)  Medications Ordered in  ED Medications - No data to display   Initial Impression / Assessment and Plan / ED Course  I have reviewed the triage vital signs and the nursing notes.  Pertinent labs & imaging results that were available during my care of the patient were reviewed by me and considered in my medical decision making (see chart for details).    Discussed pt presentation and exam findings with Dr. Vanita Panda, who personally evaluated the patient, reviewed the workup and agrees with the plan for discharge with outpatient cardiology follow-up.   Final Clinical Impressions(s) / ED Diagnoses   Final diagnoses:  Atypical chest pain   Patient is to be discharged with recommendation to follow up with PCP in regards to today's hospital visit. Chest pain is not likely of emergent cardiac or pulmonary etiology d/t presentation, PERC negative, VSS, no tracheal deviation, no JVD or new murmur, RRR, breath sounds equal bilaterally, EKG  x2 are without acute abnormalities or evidence of ischemia, negative delta troponin, and negative CXR for PNA or PTX, did show some central vascular congestion and atelectasis with stable cardiomegaly. BNP negative.  Patient has mild leukocytosis which is nonspecific, he does not have infectious symptoms at this time.  BMP has slightly elevated BUN with normal kidney function.  Advised patient of fluids for the next several days.  Pt has been advised to return to the ED if CP becomes exertional, associated with diaphoresis or nausea, radiates to left jaw/arm, worsens or becomes concerning in any way. Pt appears reliable for follow up and is agreeable to discharge.  He was given a referral for cardiology and advised to call them tomorrow morning to schedule appointment.  He was also advised follow-up with his primary care doctor within the next week.   ED Discharge Orders    None       Bishop Dublin 12/24/17 2342    Carmin Muskrat, MD 12/27/17 [phone]

## 2017-12-29 ENCOUNTER — Encounter: Payer: Self-pay | Admitting: Cardiology

## 2017-12-29 ENCOUNTER — Ambulatory Visit (INDEPENDENT_AMBULATORY_CARE_PROVIDER_SITE_OTHER): Payer: BLUE CROSS/BLUE SHIELD | Admitting: Cardiology

## 2017-12-29 VITALS — BP 138/70 | HR 83 | Ht 71.0 in | Wt 216.0 lb

## 2017-12-29 DIAGNOSIS — F17210 Nicotine dependence, cigarettes, uncomplicated: Secondary | ICD-10-CM

## 2017-12-29 DIAGNOSIS — R079 Chest pain, unspecified: Secondary | ICD-10-CM | POA: Diagnosis not present

## 2017-12-29 DIAGNOSIS — E782 Mixed hyperlipidemia: Secondary | ICD-10-CM | POA: Diagnosis not present

## 2017-12-29 DIAGNOSIS — Z87891 Personal history of nicotine dependence: Secondary | ICD-10-CM | POA: Insufficient documentation

## 2017-12-29 MED ORDER — ATORVASTATIN CALCIUM 10 MG PO TABS: 10.0000 mg | ORAL_TABLET | Freq: Every day | ORAL | 2 refills | Status: DC

## 2017-12-29 MED ORDER — ASPIRIN EC 81 MG PO TBEC: 81.0000 mg | DELAYED_RELEASE_TABLET | Freq: Every day | ORAL | 3 refills | Status: DC

## 2017-12-29 NOTE — Progress Notes (Signed)
Cardiology Office Note:    Date:  12/29/2017   ID:  Keith Zimmerman, DOB 09/05/1961, MRN [id number]  PCP:  Wardell Honour, MD  Cardiologist:  Jenean Lindau, MD   Referring MD: Wardell Honour, MD    ASSESSMENT:    1. Chest pain, unspecified type   2. Mixed dyslipidemia    PLAN:    In order of problems listed above:  1. Primary prevention stressed with the patient.  Importance of compliance with diet and medications stressed and he vocalized understanding. 2. His blood pressure is stable. 3. Lipids were reviewed extensively and diet was discussed and informed to initiate him on atorvastatin 10 mg daily.  His triglycerides are very elevated.  Diet was discussed. 4. In view of his symptoms he will undergo exercise stress echo testing.  His symptoms are atypical for coronary etiology but he has multiple risk factors for coronary artery disease. 5. If his stress test is negative I advised him to embark on exercise program and these were detailed to him at length.  Also diet was given to him in view of triglyceride and dyslipidemia status. 6. Will be seen in follow-up appointment in the month of June or earlier if he has any concerns.  He knows to go to the nearest emergency room for any concerning symptoms. 7. I spent 5 minutes with the patient discussing solely about smoking. Smoking cessation was counseled. I suggested to the patient also different medications and pharmacological interventions. Patient is keen to try stopping on its own at this time. He will get back to me if he needs any further assistance in this matter.   Medication Adjustments/Labs and Tests Ordered: Current medicines are reviewed at length with the patient today.  Concerns regarding medicines are outlined above.  No orders of the defined types were placed in this encounter.  No orders of the defined types were placed in this encounter.    History of Present Illness:    Keith Zimmerman is a 57 y.o.  male who is being seen today for the evaluation of chest pain at the request of Wardell Honour, MD.  Patient is a pleasant 57 year old male.  He has past medical history of dyslipidemia and is an active cigarette smoker smokes about 1/2 pack a day.  He is to smoke much more in the past.  He mentions to me that he was working on a car and he was trying to move something stronger and developed sudden chest pain.  For this reason he went to the emergency room and was evaluated he did not get any nitroglycerin there.  His pain was relieved and he was sent home.  No radiation of this pain to the neck or to the arms.  He leads a sedentary lifestyle.  Sexual activity does not bring around the symptoms.  At the time of my evaluation, the patient is alert awake oriented and in no distress.  History reviewed. No pertinent past medical history.  Past Surgical History:  Procedure Laterality Date  . HAND SURGERY      Current Medications: No outpatient medications have been marked as taking for the 12/29/17 encounter (Office Visit) with Brendan Gadson, Reita Cliche, MD.     Allergies:   Rhododendron   Social History   Socioeconomic History  . Marital status: Married    Spouse name: Not on file  . Number of children: Not on file  . Years of education: Not on file  . Highest education level:  Not on file  Occupational History  . Not on file  Social Needs  . Financial resource strain: Not on file  . Food insecurity:    Worry: Not on file    Inability: Not on file  . Transportation needs:    Medical: Not on file    Non-medical: Not on file  Tobacco Use  . Smoking status: Current Every Day Smoker    Packs/day: 0.50    Years: 31.00    Pack years: 15.50    Types: Cigarettes  . Smokeless tobacco: Never Used  Substance and Sexual Activity  . Alcohol use: No  . Drug use: No  . Sexual activity: Not on file  Lifestyle  . Physical activity:    Days per week: Not on file    Minutes per session: Not on file    . Stress: Not on file  Relationships  . Social connections:    Talks on phone: Not on file    Gets together: Not on file    Attends religious service: Not on file    Active member of club or organization: Not on file    Attends meetings of clubs or organizations: Not on file    Relationship status: Not on file  Other Topics Concern  . Not on file  Social History Narrative  . Not on file     Family History: The patient's family history includes COPD in his father. There is no history of Colon cancer.  ROS:   Please see the history of present illness.    All other systems reviewed and are negative.  EKGs/Labs/Other Studies Reviewed:    The following studies were reviewed today: I reviewed emergency room records and EKG with him extensively.  Also lipids were reviewed   Recent Labs: 09/30/2017: ALT 39 12/24/2017: B Natriuretic Peptide 7.4; BUN 27; Creatinine, Ser 1.20; Hemoglobin 15.3; Platelets 203; Potassium 3.8; Sodium 142  Recent Lipid Panel    Component Value Date/Time   CHOL 259 (H) 09/30/2017 1530   TRIG 371 (H) 09/30/2017 1530   HDL 37 (L) 09/30/2017 1530   CHOLHDL 7.0 (H) 09/30/2017 1530   LDLCALC 148 (H) 09/30/2017 1530    Physical Exam:    VS:  BP 138/70 (BP Location: Right Arm, Patient Position: Sitting, Cuff Size: Normal)   Pulse 83   Ht 5\' 11"  (1.803 m)   Wt 216 lb (98 kg)   SpO2 98%   BMI 30.13 kg/m     Wt Readings from Last 3 Encounters:  12/29/17 216 lb (98 kg)  12/24/17 220 lb (99.8 kg)  09/30/17 218 lb (98.9 kg)     GEN: Patient is in no acute distress HEENT: Normal NECK: No JVD; No carotid bruits LYMPHATICS: No lymphadenopathy CARDIAC: S1 S2 regular, 2/6 systolic murmur at the apex. RESPIRATORY:  Clear to auscultation without rales, wheezing or rhonchi  ABDOMEN: Soft, non-tender, non-distended MUSCULOSKELETAL:  No edema; No deformity  SKIN: Warm and dry NEUROLOGIC:  Alert and oriented x 3 PSYCHIATRIC:  Normal affect     Signed, Jenean Lindau, MD  12/29/2017 2:32 PM    [facility]

## 2017-12-29 NOTE — Patient Instructions (Signed)
Medication Instructions:  Your physician has recommended you make the following change in your medication:  START lipitor 10 mg every evening START enteric coated 81 mg aspirin daily  Labwork: Your physician recommends that you have the following labs drawn: Liver and lipid panel in 6 weeks. This may be completed at our office 8 - 4:45 pm. Be sure to come fasting for these labs.  Testing/Procedures: Your physician has requested that you have a stress echocardiogram. For further information please visit [email or web address]. Please follow instruction sheet as given.  Follow-Up: Your physician recommends that you schedule a follow-up appointment in: June, 2019  Any Other Special Instructions Will Be Listed Below (If Applicable).     If you need a refill on your cardiac medications before your next appointment, please call your pharmacy.   [place], RN, BSN    DASH Eating Plan DASH stands for "Dietary Approaches to Stop Hypertension." The DASH eating plan is a healthy eating plan that has been shown to reduce high blood pressure (hypertension). It may also reduce your risk for type 2 diabetes, heart disease, and stroke. The DASH eating plan may also help with weight loss. What are tips for following this plan? General guidelines  Avoid eating more than 2,300 mg (milligrams) of salt (sodium) a day. If you have hypertension, you may need to reduce your sodium intake to 1,500 mg a day.  Limit alcohol intake to no more than 1 drink a day for nonpregnant women and 2 drinks a day for men. One drink equals 12 oz of beer, 5 oz of wine, or 1 oz of hard liquor.  Work with your health care provider to maintain a healthy body weight or to lose weight. Ask what an ideal weight is for you.  Get at least 30 minutes of exercise that causes your heart to beat faster (aerobic exercise) most days of the week. Activities may include walking, swimming, or biking.  Work with your  health care provider or diet and nutrition specialist (dietitian) to adjust your eating plan to your individual calorie needs. Reading food labels  Check food labels for the amount of sodium per serving. Choose foods with less than 5 percent of the Daily Value of sodium. Generally, foods with less than 300 mg of sodium per serving fit into this eating plan.  To find whole grains, look for the word "whole" as the first word in the ingredient list. Shopping  Buy products labeled as "low-sodium" or "no salt added."  Buy fresh foods. Avoid canned foods and premade or frozen meals. Cooking  Avoid adding salt when cooking. Use salt-free seasonings or herbs instead of table salt or sea salt. Check with your health care provider or pharmacist before using salt substitutes.  Do not fry foods. Cook foods using healthy methods such as baking, boiling, grilling, and broiling instead.  Cook with heart-healthy oils, such as olive, canola, soybean, or sunflower oil. Meal planning   Eat a balanced diet that includes: ? 5 or more servings of fruits and vegetables each day. At each meal, try to fill half of your plate with fruits and vegetables. ? Up to 6-8 servings of whole grains each day. ? Less than 6 oz of lean meat, poultry, or fish each day. A 3-oz serving of meat is about the same size as a deck of cards. One egg equals 1 oz. ? 2 servings of low-fat dairy each day. ? A serving of nuts, seeds, or beans  5 times each week. ? Heart-healthy fats. Healthy fats called Omega-3 fatty acids are found in foods such as flaxseeds and coldwater fish, like sardines, salmon, and mackerel.  Limit how much you eat of the following: ? Canned or prepackaged foods. ? Food that is high in trans fat, such as fried foods. ? Food that is high in saturated fat, such as fatty meat. ? Sweets, desserts, sugary drinks, and other foods with added sugar. ? Full-fat dairy products.  Do not salt foods before eating.  Try  to eat at least 2 vegetarian meals each week.  Eat more home-cooked food and less restaurant, buffet, and fast food.  When eating at a restaurant, ask that your food be prepared with less salt or no salt, if possible. What foods are recommended? The items listed may not be a complete list. Talk with your dietitian about what dietary choices are best for you. Grains Whole-grain or whole-wheat bread. Whole-grain or whole-wheat pasta. Brown rice. [place]row. Bulgur. Whole-grain and low-sodium cereals. Pita bread. Low-fat, low-sodium crackers. Whole-wheat flour tortillas. Vegetables Fresh or frozen vegetables (raw, steamed, roasted, or grilled). Low-sodium or reduced-sodium tomato and vegetable juice. Low-sodium or reduced-sodium tomato sauce and tomato paste. Low-sodium or reduced-sodium canned vegetables. Fruits All fresh, dried, or frozen fruit. Canned fruit in natural juice (without added sugar). Meat and other protein foods Skinless chicken or [place]. Ground chicken or [place]. Pork with fat trimmed off. Fish and seafood. Egg whites. Dried beans, peas, or lentils. Unsalted nuts, nut butters, and seeds. Unsalted canned beans. Lean cuts of beef with fat trimmed off. Low-sodium, lean deli meat. Dairy Low-fat (1%) or fat-free (skim) milk. Fat-free, low-fat, or reduced-fat cheeses. Nonfat, low-sodium ricotta or cottage cheese. Low-fat or nonfat yogurt. Low-fat, low-sodium cheese. Fats and oils Soft margarine without trans fats. Vegetable oil. Low-fat, reduced-fat, or light mayonnaise and salad dressings (reduced-sodium). Canola, safflower, olive, soybean, and sunflower oils. Avocado. Seasoning and other foods Herbs. Spices. Seasoning mixes without salt. Unsalted popcorn and pretzels. Fat-free sweets. What foods are not recommended? The items listed may not be a complete list. Talk with your dietitian about what dietary choices are best for you. Grains Baked goods made with fat, such as  croissants, muffins, or some breads. Dry pasta or rice meal packs. Vegetables Creamed or fried vegetables. Vegetables in a cheese sauce. Regular canned vegetables (not low-sodium or reduced-sodium). Regular canned tomato sauce and paste (not low-sodium or reduced-sodium). Regular tomato and vegetable juice (not low-sodium or reduced-sodium). Angie Fava. Olives. Fruits Canned fruit in a light or heavy syrup. Fried fruit. Fruit in cream or butter sauce. Meat and other protein foods Fatty cuts of meat. Ribs. Fried meat. Berniece Salines. Sausage. Bologna and other processed lunch meats. Salami. Fatback. Hotdogs. Bratwurst. Salted nuts and seeds. Canned beans with added salt. Canned or smoked fish. Whole eggs or egg yolks. Chicken or [place] with skin. Dairy Whole or 2% milk, cream, and half-and-half. Whole or full-fat cream cheese. Whole-fat or sweetened yogurt. Full-fat cheese. Nondairy creamers. Whipped toppings. Processed cheese and cheese spreads. Fats and oils Butter. Stick margarine. Lard. Shortening. Ghee. Bacon fat. Tropical oils, such as coconut, palm kernel, or palm oil. Seasoning and other foods Salted popcorn and pretzels. Onion salt, garlic salt, seasoned salt, table salt, and sea salt. Worcestershire sauce. Tartar sauce. Barbecue sauce. Teriyaki sauce. Soy sauce, including reduced-sodium. Steak sauce. Canned and packaged gravies. Fish sauce. Oyster sauce. Cocktail sauce. Horseradish that you find on the shelf. Ketchup. Mustard. Meat flavorings and tenderizers. Bouillon cubes.  Hot sauce and Tabasco sauce. Premade or packaged marinades. Premade or packaged taco seasonings. Relishes. Regular salad dressings. Where to find more information:  National Heart, Lung, and [place]: https://wilson-eaton.com/  American Heart Association: www.heart.org Summary  The DASH eating plan is a healthy eating plan that has been shown to reduce high blood pressure (hypertension). It may also reduce your risk for type 2  diabetes, heart disease, and stroke.  With the DASH eating plan, you should limit salt (sodium) intake to 2,300 mg a day. If you have hypertension, you may need to reduce your sodium intake to 1,500 mg a day.  When on the DASH eating plan, aim to eat more fresh fruits and vegetables, whole grains, lean proteins, low-fat dairy, and heart-healthy fats.  Work with your health care provider or diet and nutrition specialist (dietitian) to adjust your eating plan to your individual calorie needs. This information is not intended to replace advice given to you by your health care provider. Make sure you discuss any questions you have with your health care provider. Document Released: 08/21/2011 Document Revised: 08/25/2016 Document Reviewed: 08/25/2016 Elsevier Interactive Patient Education  [employer].

## 2018-01-27 ENCOUNTER — Other Ambulatory Visit (HOSPITAL_BASED_OUTPATIENT_CLINIC_OR_DEPARTMENT_OTHER): Payer: BLUE CROSS/BLUE SHIELD

## 2018-03-02 ENCOUNTER — Ambulatory Visit: Payer: BLUE CROSS/BLUE SHIELD | Admitting: Cardiology

## 2018-03-04 ENCOUNTER — Ambulatory Visit: Payer: BLUE CROSS/BLUE SHIELD | Admitting: Cardiology

## 2018-04-07 ENCOUNTER — Ambulatory Visit: Payer: BLUE CROSS/BLUE SHIELD | Admitting: Cardiology

## 2018-09-28 DIAGNOSIS — Z6830 Body mass index (BMI) 30.0-30.9, adult: Secondary | ICD-10-CM | POA: Diagnosis not present

## 2018-09-28 DIAGNOSIS — J0100 Acute maxillary sinusitis, unspecified: Secondary | ICD-10-CM | POA: Diagnosis not present

## 2019-01-19 ENCOUNTER — Ambulatory Visit (INDEPENDENT_AMBULATORY_CARE_PROVIDER_SITE_OTHER): Payer: BLUE CROSS/BLUE SHIELD | Admitting: Family Medicine

## 2019-01-19 ENCOUNTER — Encounter: Payer: Self-pay | Admitting: Family Medicine

## 2019-01-19 ENCOUNTER — Other Ambulatory Visit: Payer: Self-pay

## 2019-01-19 VITALS — BP 135/85 | HR 67 | Temp 97.3°F | Ht 71.0 in | Wt 220.0 lb

## 2019-01-19 DIAGNOSIS — Z0001 Encounter for general adult medical examination with abnormal findings: Secondary | ICD-10-CM

## 2019-01-19 DIAGNOSIS — Z9189 Other specified personal risk factors, not elsewhere classified: Secondary | ICD-10-CM

## 2019-01-19 DIAGNOSIS — Z0000 Encounter for general adult medical examination without abnormal findings: Secondary | ICD-10-CM

## 2019-01-19 DIAGNOSIS — E782 Mixed hyperlipidemia: Secondary | ICD-10-CM

## 2019-01-19 DIAGNOSIS — Z131 Encounter for screening for diabetes mellitus: Secondary | ICD-10-CM

## 2019-01-19 DIAGNOSIS — F17210 Nicotine dependence, cigarettes, uncomplicated: Secondary | ICD-10-CM | POA: Diagnosis not present

## 2019-01-19 MED ORDER — ATORVASTATIN CALCIUM 10 MG PO TABS: 10.0000 mg | ORAL_TABLET | Freq: Every day | ORAL | 3 refills | Status: DC

## 2019-01-19 NOTE — Progress Notes (Signed)
BP 135/85   Pulse 67   Temp (!) 97.3 F (36.3 C) (Oral)   Ht '5\' 11"'  (1.803 m)   Wt 220 lb (99.8 kg)   BMI 30.68 kg/m    Subjective:   Patient ID: Keith Zimmerman, male    DOB: [birth year], 58 y.o.   MRN: [id number]  HPI: Keith Zimmerman is a 58 y.o. male presenting on 01/19/2019 for Hyperlipidemia (check up of chronic medical conditions)   HPI Adult well exam and physical and recheck of chronic issues, patient denies any major health issues and says he felt a lot better when he took the Lipitor.  Hyperlipidemia Patient is coming in for recheck of his hyperlipidemia. The patient is currently taking Lipitor. They deny any issues with myalgias or history of liver damage from it. They deny any focal numbness or weakness or chest pain.  Evaluated last year for some chest pain in the emergency department and saw cardiologist.  Smoking, patient is reducing and trying to quit on his own.  Relevant past medical, surgical, family and social history reviewed and updated as indicated. Interim medical history since our last visit reviewed. Allergies and medications reviewed and updated.  Review of Systems  Constitutional: Negative for chills and fever.  Eyes: Negative for visual disturbance.  Respiratory: Negative for shortness of breath and wheezing.   Cardiovascular: Negative for chest pain and leg swelling.  Gastrointestinal: Negative for abdominal pain, constipation, diarrhea, nausea and vomiting.  Musculoskeletal: Negative for back pain and gait problem.  Skin: Negative for rash.  Neurological: Negative for dizziness, weakness and light-headedness.  All other systems reviewed and are negative.   Per HPI unless specifically indicated above   Allergies as of 01/19/2019      Reactions   Rhododendron Other (See Comments)   "blistering"      Medication List       Accurate as of Jan 19, 2019 10:24 AM. Always use your most recent med list.        aspirin EC 81 MG tablet Take 1  tablet (81 mg total) by mouth daily.   atorvastatin 10 MG tablet Commonly known as:  LIPITOR Take 1 tablet (10 mg total) by mouth daily at 6 PM.        Objective:   BP 135/85   Pulse 67   Temp (!) 97.3 F (36.3 C) (Oral)   Ht '5\' 11"'  (1.803 m)   Wt 220 lb (99.8 kg)   BMI 30.68 kg/m   Wt Readings from Last 3 Encounters:  01/19/19 220 lb (99.8 kg)  12/29/17 216 lb (98 kg)  12/24/17 220 lb (99.8 kg)    Physical Exam Vitals signs and nursing note reviewed.  Constitutional:      General: He is not in acute distress.    Appearance: He is well-developed. He is not diaphoretic.  Eyes:     General: No scleral icterus.    Conjunctiva/sclera: Conjunctivae normal.  Neck:     Musculoskeletal: Neck supple.     Thyroid: No thyromegaly.  Cardiovascular:     Rate and Rhythm: Normal rate and regular rhythm.     Heart sounds: Normal heart sounds. No murmur.  Pulmonary:     Effort: Pulmonary effort is normal. No respiratory distress.     Breath sounds: Normal breath sounds. No wheezing.  Abdominal:     General: Abdomen is flat. Bowel sounds are normal. There is no distension.     Palpations: Abdomen is soft. There is no mass.  Tenderness: There is no abdominal tenderness.     Hernia: A hernia (Small umbilical hernia, nothing protruding currently and no tenderness) is present.  Musculoskeletal: Normal range of motion.  Lymphadenopathy:     Cervical: No cervical adenopathy.  Skin:    General: Skin is warm and dry.     Findings: No rash.  Neurological:     Mental Status: He is alert and oriented to person, place, and time.     Coordination: Coordination normal.  Psychiatric:        Behavior: Behavior normal.       Assessment & Plan:   Problem List Items Addressed This Visit      Other   Mixed dyslipidemia   Relevant Medications   atorvastatin (LIPITOR) 10 MG tablet   Other Relevant Orders   Lipid panel   Cigarette smoker   Relevant Orders   CBC with  Differential/Platelet    Other Visit Diagnoses    Well adult exam    -  Primary   Diabetes mellitus screening       Relevant Orders   CMP14+EGFR   [place] cardiac risk >20% in next 10 years       Relevant Orders   Lipid panel      The 10-year ASCVD risk score Mikey Bussing DC Jr., et al., 2013) is: 20%   Values used to calculate the score:     Age: 63 years     Sex: Male     Is Non-Hispanic African American: No     Diabetic: No     Tobacco smoker: Yes     Systolic Blood Pressure: 364 mmHg     Is BP treated: No     HDL Cholesterol: 37 mg/dL     Total Cholesterol: 259 mg/dL   Follow up plan: Return in about 1 year (around 01/19/2020), or if symptoms worsen or fail to improve, for Hyperlipidemia and adult well exam.  Counseling provided for all of the vaccine components Orders Placed This Encounter  Procedures  . CBC with Differential/Platelet  . CMP14+EGFR  . Lipid panel    Caryl Pina, MD [facility] 01/19/2019, 10:24 AM

## 2019-01-20 LAB — CMP14+EGFR
ALT: 35 IU/L (ref 0–44)
AST: 15 IU/L (ref 0–40)
Albumin/Globulin Ratio: 2.4 — ABNORMAL HIGH (ref 1.2–2.2)
Albumin: 4.8 g/dL (ref 3.8–4.9)
Alkaline Phosphatase: 106 IU/L (ref 39–117)
BUN/Creatinine Ratio: 22 — ABNORMAL HIGH (ref 9–20)
BUN: 21 mg/dL (ref 6–24)
Bilirubin Total: 0.9 mg/dL (ref 0.0–1.2)
CO2: 22 mmol/L (ref 20–29)
Calcium: 9.5 mg/dL (ref 8.7–10.2)
Chloride: 105 mmol/L (ref 96–106)
Creatinine, Ser: 0.97 mg/dL (ref 0.76–1.27)
GFR calc Af Amer: 100 mL/min/{1.73_m2} (ref >59)
GFR calc non Af Amer: 87 mL/min/{1.73_m2} (ref >59)
Globulin, Total: 2 g/dL (ref 1.5–4.5)
Glucose: 134 mg/dL — ABNORMAL HIGH (ref 65–99)
Potassium: 4.3 mmol/L (ref 3.5–5.2)
Sodium: 139 mmol/L (ref 134–144)
Total Protein: 6.8 g/dL (ref 6.0–8.5)

## 2019-01-20 LAB — LIPID PANEL
Chol/HDL Ratio: 6.7 ratio — ABNORMAL HIGH (ref 0.0–5.0)
Cholesterol, Total: 233 mg/dL — ABNORMAL HIGH (ref 100–199)
HDL: 35 mg/dL — ABNORMAL LOW (ref >39)
LDL Calculated: 145 mg/dL — ABNORMAL HIGH (ref 0–99)
Triglycerides: 264 mg/dL — ABNORMAL HIGH (ref 0–149)
VLDL Cholesterol Cal: 53 mg/dL — ABNORMAL HIGH (ref 5–40)

## 2019-01-20 LAB — CBC WITH DIFFERENTIAL/PLATELET
Basophils Absolute: 0.1 10*3/uL (ref 0.0–0.2)
Basos: 1 %
EOS (ABSOLUTE): 0.2 10*3/uL (ref 0.0–0.4)
Eos: 3 %
Hematocrit: 45 % (ref 37.5–51.0)
Hemoglobin: 16 g/dL (ref 13.0–17.7)
Immature Grans (Abs): 0.1 10*3/uL (ref 0.0–0.1)
Immature Granulocytes: 1 %
Lymphocytes Absolute: 1.9 10*3/uL (ref 0.7–3.1)
Lymphs: 22 %
MCH: 31 pg (ref 26.6–33.0)
MCHC: 35.6 g/dL (ref 31.5–35.7)
MCV: 87 fL (ref 79–97)
Monocytes Absolute: 0.7 10*3/uL (ref 0.1–0.9)
Monocytes: 9 %
Neutrophils Absolute: 5.6 10*3/uL (ref 1.4–7.0)
Neutrophils: 64 %
Platelets: 193 10*3/uL (ref 150–450)
RBC: 5.16 x10E6/uL (ref 4.14–5.80)
RDW: 12.7 % (ref 11.6–15.4)
WBC: 8.6 10*3/uL (ref 3.4–10.8)

## 2019-12-09 ENCOUNTER — Telehealth: Payer: Self-pay | Admitting: Family Medicine

## 2019-12-09 NOTE — Telephone Encounter (Signed)
Pt scheduled with Dr Warrick Parisian 12/14/19 at 8:55 for video visit.

## 2019-12-14 ENCOUNTER — Encounter: Payer: Self-pay | Admitting: Family Medicine

## 2019-12-14 ENCOUNTER — Telehealth (INDEPENDENT_AMBULATORY_CARE_PROVIDER_SITE_OTHER): Payer: BC Managed Care – PPO | Admitting: Family Medicine

## 2019-12-14 DIAGNOSIS — M25541 Pain in joints of right hand: Secondary | ICD-10-CM

## 2019-12-14 DIAGNOSIS — E782 Mixed hyperlipidemia: Secondary | ICD-10-CM

## 2019-12-14 DIAGNOSIS — M25542 Pain in joints of left hand: Secondary | ICD-10-CM

## 2019-12-14 MED ORDER — PRAVASTATIN SODIUM 10 MG PO TABS: 10.0000 mg | ORAL_TABLET | Freq: Every day | ORAL | 3 refills | Status: DC

## 2019-12-14 NOTE — Progress Notes (Signed)
   Virtual Visit via Mychart Video Note  I connected with Keith Zimmerman on 12/14/19 at 0905 by video and verified that I am speaking with the correct person using two identifiers. Keith Zimmerman is currently located at home and no other people are currently with her during visit. The provider, Fransisca Kaufmann Sakib Noguez, MD is located in their office at time of visit.  Call ended at [phone]  I discussed the limitations, risks, security and privacy concerns of performing an evaluation and management service by video and the availability of in person appointments. I also discussed with the patient that there may be a patient responsible charge related to this service. The patient expressed understanding and agreed to proceed.   History and Present Illness: patient is calling in for tingling and numbness and pain in hands and this has been going on since starting atorvastatin and are worse through day. Left hand goes numb with overhead reaching that is more increasing for the past year. Got better initially on cholesterol medicine and now better with tapering cholesterol med to every other day.   No diagnosis found.  Outpatient Encounter Medications as of 12/14/2019  Medication Sig  . aspirin EC 81 MG tablet Take 1 tablet (81 mg total) by mouth daily.  Marland Kitchen atorvastatin (LIPITOR) 10 MG tablet Take 1 tablet (10 mg total) by mouth daily at 6 PM.   No facility-administered encounter medications on file as of 12/14/2019.    Review of Systems  Constitutional: Negative for chills and fever.  Eyes: Negative for visual disturbance.  Respiratory: Negative for shortness of breath and wheezing.   Cardiovascular: Negative for chest pain and leg swelling.  Musculoskeletal: Negative for back pain and gait problem.  Skin: Negative for rash.  Neurological: Positive for numbness. Negative for dizziness and weakness.  All other systems reviewed and are negative.   Observations/Objective: Patient sounds comfortable  and in no acute distress  Assessment and Plan: Problem List Items Addressed This Visit      Other   Mixed dyslipidemia - Primary   Relevant Medications   pravastatin (PRAVACHOL) 10 MG tablet    Other Visit Diagnoses    Arthralgia of both hands          He attributes that some of his arthralgias are may be due to the atorvastatin so we will switch to pravastatin, taper off and be off the other 1 at least a week and we will see if it improves from there. Follow up plan: Return in about 2 months (around 02/13/2020), or if symptoms worsen or fail to improve, for hyperlipidemia.     I discussed the assessment and treatment plan with the patient. The patient was provided an opportunity to ask questions and all were answered. The patient agreed with the plan and demonstrated an understanding of the instructions.   The patient was advised to call back or seek an in-person evaluation if the symptoms worsen or if the condition fails to improve as anticipated.  The above assessment and management plan was discussed with the patient. The patient verbalized understanding of and has agreed to the management plan. Patient is aware to call the clinic if symptoms persist or worsen. Patient is aware when to return to the clinic for a follow-up visit. Patient educated on when it is appropriate to go to the emergency department.    I provided 13 minutes of non-face-to-face time during this encounter.    Worthy Rancher, MD

## 2020-01-23 ENCOUNTER — Encounter: Payer: BLUE CROSS/BLUE SHIELD | Admitting: Family Medicine

## 2020-02-22 ENCOUNTER — Other Ambulatory Visit: Payer: Self-pay

## 2020-02-22 ENCOUNTER — Ambulatory Visit (INDEPENDENT_AMBULATORY_CARE_PROVIDER_SITE_OTHER): Payer: BC Managed Care – PPO | Admitting: Family Medicine

## 2020-02-22 ENCOUNTER — Encounter: Payer: Self-pay | Admitting: Family Medicine

## 2020-02-22 VITALS — BP 132/73 | HR 75 | Temp 97.9°F | Ht 71.0 in | Wt 210.2 lb

## 2020-02-22 DIAGNOSIS — Z1211 Encounter for screening for malignant neoplasm of colon: Secondary | ICD-10-CM

## 2020-02-22 DIAGNOSIS — E782 Mixed hyperlipidemia: Secondary | ICD-10-CM | POA: Diagnosis not present

## 2020-02-22 DIAGNOSIS — Z125 Encounter for screening for malignant neoplasm of prostate: Secondary | ICD-10-CM | POA: Diagnosis not present

## 2020-02-22 DIAGNOSIS — Z0001 Encounter for general adult medical examination with abnormal findings: Secondary | ICD-10-CM

## 2020-02-22 DIAGNOSIS — Z0000 Encounter for general adult medical examination without abnormal findings: Secondary | ICD-10-CM

## 2020-02-22 DIAGNOSIS — Z716 Tobacco abuse counseling: Secondary | ICD-10-CM

## 2020-02-22 MED ORDER — ATORVASTATIN CALCIUM 10 MG PO TABS: 10.0000 mg | ORAL_TABLET | Freq: Every day | ORAL | 3 refills | Status: DC

## 2020-02-22 MED ORDER — TADALAFIL 5 MG PO TABS: 5.0000 mg | ORAL_TABLET | Freq: Every day | ORAL | 5 refills | Status: DC | PRN

## 2020-02-22 NOTE — Progress Notes (Signed)
BP 132/73   Pulse 75   Temp 97.9 F (36.6 C) (Temporal)   Ht '5\' 11"'  (1.803 m)   Wt 210 lb 4 oz (95.4 kg)   BMI 29.32 kg/m    Subjective:   Patient ID: Keith Zimmerman, male    DOB: June 14, 1961, 59 y.o.   MRN: [id number]  HPI: Keith Zimmerman is a 59 y.o. male presenting on 02/22/2020 for Annual Exam   HPI Adult well exam and physical Patient is coming in today for adult well exam and physical.  Really really complains that he has is that he tried Viagra and it did not work and would like to try Cialis. Patient denies any chest pain, shortness of breath, headaches or vision issues, abdominal complaints, diarrhea, nausea, vomiting, or joint issues.  Patient is still a smoker although is trying to reduce.  He does have some coughing spells occasionally have some issues during the spring but that is since improved.  Hyperlipidemia Patient is coming in for recheck of his hyperlipidemia. The patient is currently taking atorvastatin, he stopped his pravastatin because it did not make him feel good.. They deny any issues with myalgias or history of liver damage from it. They deny any focal numbness or weakness or chest pain.   Relevant past medical, surgical, family and social history reviewed and updated as indicated. Interim medical history since our last visit reviewed. Allergies and medications reviewed and updated.  Review of Systems  Constitutional: Negative for chills and fever.  HENT: Negative for ear pain and tinnitus.   Eyes: Negative for pain.  Respiratory: Negative for cough, shortness of breath and wheezing.   Cardiovascular: Negative for chest pain, palpitations and leg swelling.  Gastrointestinal: Negative for abdominal pain, blood in stool, constipation and diarrhea.  Genitourinary: Negative for dysuria and hematuria.  Musculoskeletal: Negative for back pain and myalgias.  Skin: Negative for rash.  Neurological: Negative for dizziness, weakness and headaches.    Psychiatric/Behavioral: Negative for suicidal ideas.    Per HPI unless specifically indicated above   Allergies as of 02/22/2020      Reactions   Rhododendron Other (See Comments)   "blistering"      Medication List       Accurate as of February 22, 2020  2:50 PM. If you have any questions, ask your nurse or doctor.        STOP taking these medications   aspirin EC 81 MG tablet Stopped by: Fransisca Kaufmann Jerald Hennington, MD     TAKE these medications   pravastatin 10 MG tablet Commonly known as: PRAVACHOL Take 1 tablet (10 mg total) by mouth daily.        Objective:   BP 132/73   Pulse 75   Temp 97.9 F (36.6 C) (Temporal)   Ht '5\' 11"'  (1.803 m)   Wt 210 lb 4 oz (95.4 kg)   BMI 29.32 kg/m   Wt Readings from Last 3 Encounters:  02/22/20 210 lb 4 oz (95.4 kg)  01/19/19 220 lb (99.8 kg)  12/29/17 216 lb (98 kg)    Physical Exam Vitals and nursing note reviewed.  Constitutional:      General: He is not in acute distress.    Appearance: He is well-developed. He is not diaphoretic.  HENT:     Right Ear: External ear normal.     Left Ear: External ear normal.     Nose: Nose normal.     Mouth/Throat:     Pharynx: No oropharyngeal exudate.  Eyes:     General: No scleral icterus.       Right eye: No discharge.     Conjunctiva/sclera: Conjunctivae normal.     Pupils: Pupils are equal, round, and reactive to light.  Neck:     Thyroid: No thyromegaly.  Cardiovascular:     Rate and Rhythm: Normal rate and regular rhythm.     Heart sounds: Normal heart sounds. No murmur.  Pulmonary:     Effort: Pulmonary effort is normal. No respiratory distress.     Breath sounds: Normal breath sounds. No wheezing.  Abdominal:     General: Bowel sounds are normal. There is no distension.     Palpations: Abdomen is soft.     Tenderness: There is no abdominal tenderness. There is no guarding or rebound.  Musculoskeletal:        General: Normal range of motion.     Cervical back: Neck  supple.  Lymphadenopathy:     Cervical: No cervical adenopathy.  Skin:    General: Skin is warm and dry.     Findings: No rash.  Neurological:     Mental Status: He is alert and oriented to person, place, and time.     Coordination: Coordination normal.  Psychiatric:        Behavior: Behavior normal.       Assessment & Plan:   Problem List Items Addressed This Visit      Other   Mixed dyslipidemia   Relevant Medications   atorvastatin (LIPITOR) 10 MG tablet   Other Relevant Orders   Lipid panel    Other Visit Diagnoses    Well adult exam    -  Primary   Relevant Orders   CBC with Differential/Platelet   CMP14+EGFR   Lipid panel   Prostate cancer screening       Relevant Orders   PSA, total and free   Colon cancer screening       Relevant Orders   Ambulatory referral to Gastroenterology      Patient declines smoking cessation he says he is trying to reduce on his own.  Changed his prescription back to Lipitor because he went back on it because he said the pravastatin did not make him feel good.  He is taking it every other day.  Patient declines any smoking cessation help Follow up plan: Return in about 1 year (around 02/21/2021), or if symptoms worsen or fail to improve.  Counseling provided for all of the vaccine components No orders of the defined types were placed in this encounter.   Caryl Pina, MD [facility] 02/22/2020, 2:50 PM

## 2020-02-23 LAB — CMP14+EGFR
ALT: 33 IU/L (ref 0–44)
AST: 19 IU/L (ref 0–40)
Albumin/Globulin Ratio: 2.1 (ref 1.2–2.2)
Albumin: 4.7 g/dL (ref 3.8–4.9)
Alkaline Phosphatase: 112 IU/L (ref 48–121)
BUN/Creatinine Ratio: 24 — ABNORMAL HIGH (ref 9–20)
BUN: 21 mg/dL (ref 6–24)
Bilirubin Total: 0.6 mg/dL (ref 0.0–1.2)
CO2: 21 mmol/L (ref 20–29)
Calcium: 9.6 mg/dL (ref 8.7–10.2)
Chloride: 104 mmol/L (ref 96–106)
Creatinine, Ser: 0.89 mg/dL (ref 0.76–1.27)
GFR calc Af Amer: 110 mL/min/{1.73_m2} (ref >59)
GFR calc non Af Amer: 95 mL/min/{1.73_m2} (ref >59)
Globulin, Total: 2.2 g/dL (ref 1.5–4.5)
Glucose: 103 mg/dL — ABNORMAL HIGH (ref 65–99)
Potassium: 4.1 mmol/L (ref 3.5–5.2)
Sodium: 141 mmol/L (ref 134–144)
Total Protein: 6.9 g/dL (ref 6.0–8.5)

## 2020-02-23 LAB — LIPID PANEL
Chol/HDL Ratio: 5.9 ratio — ABNORMAL HIGH (ref 0.0–5.0)
Cholesterol, Total: 207 mg/dL — ABNORMAL HIGH (ref 100–199)
HDL: 35 mg/dL — ABNORMAL LOW (ref >39)
LDL Chol Calc (NIH): 106 mg/dL — ABNORMAL HIGH (ref 0–99)
Triglycerides: 386 mg/dL — ABNORMAL HIGH (ref 0–149)
VLDL Cholesterol Cal: 66 mg/dL — ABNORMAL HIGH (ref 5–40)

## 2020-02-23 LAB — CBC WITH DIFFERENTIAL/PLATELET
Basophils Absolute: 0.1 10*3/uL (ref 0.0–0.2)
Basos: 1 %
EOS (ABSOLUTE): 0.5 10*3/uL — ABNORMAL HIGH (ref 0.0–0.4)
Eos: 4 %
Hematocrit: 46.2 % (ref 37.5–51.0)
Hemoglobin: 15.5 g/dL (ref 13.0–17.7)
Immature Grans (Abs): 0.1 10*3/uL (ref 0.0–0.1)
Immature Granulocytes: 1 %
Lymphocytes Absolute: 2.1 10*3/uL (ref 0.7–3.1)
Lymphs: 20 %
MCH: 30.3 pg (ref 26.6–33.0)
MCHC: 33.5 g/dL (ref 31.5–35.7)
MCV: 90 fL (ref 79–97)
Monocytes Absolute: 1 10*3/uL — ABNORMAL HIGH (ref 0.1–0.9)
Monocytes: 10 %
Neutrophils Absolute: 6.7 10*3/uL (ref 1.4–7.0)
Neutrophils: 64 %
Platelets: 183 10*3/uL (ref 150–450)
RBC: 5.12 x10E6/uL (ref 4.14–5.80)
RDW: 12.6 % (ref 11.6–15.4)
WBC: 10.4 10*3/uL (ref 3.4–10.8)

## 2020-02-23 LAB — PSA, TOTAL AND FREE
PSA, Free Pct: 24.6 %
PSA, Free: 0.32 ng/mL
Prostate Specific Ag, Serum: 1.3 ng/mL (ref 0.0–4.0)

## 2020-04-17 ENCOUNTER — Encounter: Payer: Self-pay | Admitting: Internal Medicine

## 2020-05-28 ENCOUNTER — Telehealth: Payer: Self-pay | Admitting: Family Medicine

## 2020-05-28 ENCOUNTER — Encounter: Payer: Self-pay | Admitting: Family Medicine

## 2020-05-28 ENCOUNTER — Ambulatory Visit (INDEPENDENT_AMBULATORY_CARE_PROVIDER_SITE_OTHER): Payer: BC Managed Care – PPO | Admitting: Family Medicine

## 2020-05-28 ENCOUNTER — Other Ambulatory Visit: Payer: Self-pay

## 2020-05-28 VITALS — BP 134/76 | HR 79 | Temp 98.0°F | Ht 71.0 in | Wt 219.0 lb

## 2020-05-28 DIAGNOSIS — M779 Enthesopathy, unspecified: Secondary | ICD-10-CM

## 2020-05-28 MED ORDER — SILDENAFIL CITRATE 20 MG PO TABS
20.0000 mg | ORAL_TABLET | ORAL | 1 refills | Status: DC | PRN
Start: 2020-05-28 — End: 2020-05-29

## 2020-05-28 MED ORDER — PREDNISONE 20 MG PO TABS: ORAL_TABLET | ORAL | 0 refills | Status: DC

## 2020-05-28 MED ORDER — CYCLOBENZAPRINE HCL 10 MG PO TABS: 10.0000 mg | ORAL_TABLET | Freq: Three times a day (TID) | ORAL | 0 refills | Status: DC | PRN

## 2020-05-28 NOTE — Progress Notes (Signed)
   BP 134/76   Pulse 79   Temp 98 F (36.7 C)   Ht 5\' 11"  (1.803 m)   Wt 219 lb (99.3 kg)   SpO2 95%   BMI 30.54 kg/m    Subjective:   Patient ID: Keith Zimmerman, male    DOB: 22-Dec-1960, 59 y.o.   MRN: [id number]  HPI: Keith Zimmerman is a 59 y.o. male presenting on 05/28/2020 for No chief complaint on file.   HPI Patient is coming in to take complaining of pain in his hand/especially around his first and second finger where there is a lot of swelling in his hand.  He says he denies any specific trauma on it although he has been doing repetitive motions with his hands.  He says is been bothering him over the past 4 to 5 days.  He has been using ibuprofen and Aleve without much success.  Relevant past medical, surgical, family and social history reviewed and updated as indicated. Interim medical history since our last visit reviewed. Allergies and medications reviewed and updated.  Review of Systems  Constitutional: Negative for chills and fever.  Respiratory: Negative for shortness of breath and wheezing.   Cardiovascular: Negative for chest pain and leg swelling.  Musculoskeletal: Positive for arthralgias and joint swelling. Negative for back pain and gait problem.  Skin: Negative for color change, rash and wound.  All other systems reviewed and are negative.   Per HPI unless specifically indicated above    Objective:   BP 134/76   Pulse 79   Temp 98 F (36.7 C)   Ht 5\' 11"  (1.803 m)   Wt 219 lb (99.3 kg)   SpO2 95%   BMI 30.54 kg/m   Wt Readings from Last 3 Encounters:  05/28/20 219 lb (99.3 kg)  02/22/20 210 lb 4 oz (95.4 kg)  01/19/19 220 lb (99.8 kg)    Physical Exam Vitals and nursing note reviewed.  Constitutional:      General: He is not in acute distress.    Appearance: He is well-developed. He is not diaphoretic.  Eyes:     General: No scleral icterus.    Conjunctiva/sclera: Conjunctivae normal.  Neck:     Thyroid: No thyromegaly.    Musculoskeletal:        General: Normal range of motion.       Hands:  Skin:    General: Skin is warm and dry.     Findings: No erythema or rash.  Neurological:     Mental Status: He is alert and oriented to person, place, and time.     Coordination: Coordination normal.  Psychiatric:        Behavior: Behavior normal.      Assessment & Plan:   Problem List Items Addressed This Visit    None    Visit Diagnoses    Tendinitis    -  Primary   Relevant Medications   cyclobenzaprine (FLEXERIL) 10 MG tablet   predniSONE (DELTASONE) 20 MG tablet      Will give prednisone and Flexeril for patient. Follow up plan: Return if symptoms worsen or fail to improve.  Counseling provided for all of the vaccine components No orders of the defined types were placed in this encounter.   Caryl Pina, MD [facility] 05/28/2020, 3:40 PM

## 2020-05-29 MED ORDER — SILDENAFIL CITRATE 20 MG PO TABS: 20.0000 mg | ORAL_TABLET | ORAL | 1 refills | Status: DC | PRN

## 2020-05-29 NOTE — Telephone Encounter (Signed)
Patient aware.  Coupon printed for patient and Sildenafil Rx resent to [place]

## 2020-05-29 NOTE — Telephone Encounter (Signed)
Have him use good [email or web address] and resend the prescription to [facility] and it should be a lot cheaper

## 2020-06-04 ENCOUNTER — Ambulatory Visit (AMBULATORY_SURGERY_CENTER): Payer: Self-pay | Admitting: *Deleted

## 2020-06-04 ENCOUNTER — Encounter: Payer: Self-pay | Admitting: Gastroenterology

## 2020-06-04 ENCOUNTER — Other Ambulatory Visit: Payer: Self-pay

## 2020-06-04 VITALS — Ht 71.0 in | Wt 217.0 lb

## 2020-06-04 DIAGNOSIS — Z1211 Encounter for screening for malignant neoplasm of colon: Secondary | ICD-10-CM

## 2020-06-04 DIAGNOSIS — Z01818 Encounter for other preprocedural examination: Secondary | ICD-10-CM

## 2020-06-04 NOTE — Progress Notes (Signed)
No egg or soy allergy known to patient  No issues with past sedation with any surgeries or procedures no intubation problems in the past  No FH of Malignant Hyperthermia No diet pills per patient No home 02 use per patient  No blood thinners per patient  Pt denies issues with constipation - occ issue but coffee fixes this -  No A fib or A flutter  EMMI video to pt or via [facility] guidelines implemented in PV today with Pt and RN     Due to the COVID-19 pandemic we are asking patients to follow these guidelines. Please only bring one care partner. Please be aware that your care partner may wait in the car in the parking lot or if they feel like they will be too hot to wait in the car, they may wait in the lobby on the 4th floor. All care partners are required to wear a mask the entire time (we do not have any that we can provide them), they need to practice social distancing, and we will do a Covid check for all patient's and care partners when you arrive. Also we will check their temperature and your temperature. If the care partner waits in their car they need to stay in the parking lot the entire time and we will call them on their cell phone when the patient is ready for discharge so they can bring the car to the front of the building. Also all patient's will need to wear a mask into building.

## 2020-06-13 ENCOUNTER — Other Ambulatory Visit: Payer: Self-pay | Admitting: Gastroenterology

## 2020-06-13 DIAGNOSIS — Z1159 Encounter for screening for other viral diseases: Secondary | ICD-10-CM | POA: Diagnosis not present

## 2020-06-13 LAB — SARS CORONAVIRUS 2 (TAT 6-24 HRS): SARS Coronavirus 2: NEGATIVE

## 2020-06-17 ENCOUNTER — Encounter: Payer: Self-pay | Admitting: Certified Registered Nurse Anesthetist

## 2020-06-18 ENCOUNTER — Other Ambulatory Visit: Payer: Self-pay

## 2020-06-18 ENCOUNTER — Encounter: Payer: BLUE CROSS/BLUE SHIELD | Admitting: Internal Medicine

## 2020-06-18 ENCOUNTER — Ambulatory Visit (AMBULATORY_SURGERY_CENTER): Payer: BC Managed Care – PPO | Admitting: Gastroenterology

## 2020-06-18 ENCOUNTER — Encounter: Payer: Self-pay | Admitting: Gastroenterology

## 2020-06-18 ENCOUNTER — Encounter: Payer: BLUE CROSS/BLUE SHIELD | Admitting: Gastroenterology

## 2020-06-18 VITALS — BP 117/73 | HR 54 | Temp 98.0°F | Resp 22 | Ht 71.0 in | Wt 217.0 lb

## 2020-06-18 DIAGNOSIS — Z1211 Encounter for screening for malignant neoplasm of colon: Secondary | ICD-10-CM | POA: Diagnosis not present

## 2020-06-18 DIAGNOSIS — D122 Benign neoplasm of ascending colon: Secondary | ICD-10-CM | POA: Diagnosis not present

## 2020-06-18 DIAGNOSIS — D123 Benign neoplasm of transverse colon: Secondary | ICD-10-CM | POA: Diagnosis not present

## 2020-06-18 DIAGNOSIS — D124 Benign neoplasm of descending colon: Secondary | ICD-10-CM | POA: Diagnosis not present

## 2020-06-18 MED ORDER — SODIUM CHLORIDE 0.9 % IV SOLN: 500.0000 mL | INTRAVENOUS | Status: DC

## 2020-06-18 NOTE — Op Note (Signed)
[facility] Patient Name: Keith Zimmerman Procedure Date: 06/18/2020 3:00 PM MRN: [id number] Endoscopist: Davison. Loletha Carrow , MD Age: 59 Referring MD:  Date of Birth: Oct 03, 1960 Gender: Male Account #: [id number] Procedure:                Colonoscopy Indications:              Screening for colorectal malignant neoplasm, This                            is the patient's first colonoscopy Medicines:                Monitored Anesthesia Care Procedure:                Pre-Anesthesia Assessment:                           - Prior to the procedure, a History and Physical                            was performed, and patient medications and                            allergies were reviewed. The patient's tolerance of                            previous anesthesia was also reviewed. The risks                            and benefits of the procedure and the sedation                            options and risks were discussed with the patient.                            All questions were answered, and informed consent                            was obtained. Prior Anticoagulants: The patient has                            taken no previous anticoagulant or antiplatelet                            agents. ASA Grade Assessment: II - A patient with                            mild systemic disease. After reviewing the risks                            and benefits, the patient was deemed in                            satisfactory condition to undergo the procedure.  After obtaining informed consent, the colonoscope                            was passed under direct vision. Throughout the                            procedure, the patient's blood pressure, pulse, and                            oxygen saturations were monitored continuously. The                            Colonoscope was introduced through the anus and                            advanced to the the cecum,  identified by                            appendiceal orifice and ileocecal valve. The                            colonoscopy was performed without difficulty. The                            patient tolerated the procedure well. The quality                            of the bowel preparation was good (after lavage).                            The ileocecal valve, appendiceal orifice, and                            rectum were photographed. The bowel preparation                            used was Miralax. Scope In: 3:17:35 PM Scope Out: 3:42:21 PM Scope Withdrawal Time: 0 hours 22 minutes 10 seconds  Total Procedure Duration: 0 hours 24 minutes 46 seconds  Findings:                 The perianal and digital rectal examinations were                            normal.                           Three sessile polyps were found in the proximal                            descending colon, distal transverse colon and                            ascending colon. The polyps were 4 to 6 mm in size.  These polyps were removed with a cold snare.                            Resection and retrieval were complete. (Jar 1)                           A 6-8 mm polyp was found in the mid transverse                            colon. The polyp was pedunculated. The polyp was                            removed with a hot snare. Resection and retrieval                            were complete. (Jar 1)                           A 10 mm polyp was found in the proximal descending                            colon. The polyp was pedunculated. The polyp was                            removed with a hot snare. Resection and retrieval                            were complete.                           A single diverticulum was found in the right colon.                           Internal hemorrhoids were found. The hemorrhoids                            were medium-sized.                            The exam was otherwise without abnormality on                            direct and retroflexion views. Complications:            No immediate complications. Estimated Blood Loss:     Estimated blood loss was minimal. Impression:               - Three 4 to 6 mm polyps in the proximal descending                            colon, in the distal transverse colon and in the                            ascending colon, removed with a cold snare.  Resected and retrieved.                           - One 6-8 mm polyp in the mid transverse colon,                            removed with a hot snare. Resected and retrieved.                           - One 10 mm polyp in the proximal descending colon,                            removed with a hot snare. Resected and retrieved.                           - Diverticulosis in the right colon.                           - Internal hemorrhoids.                           - The examination was otherwise normal on direct                            and retroflexion views. Recommendation:           - Patient has a contact number available for                            emergencies. The signs and symptoms of potential                            delayed complications were discussed with the                            patient. Return to normal activities tomorrow.                            Written discharge instructions were provided to the                            patient.                           - Resume previous diet.                           - Continue present medications.                           - Await pathology results.                           - Repeat colonoscopy is recommended for  surveillance. The colonoscopy date will be                            determined after pathology results from today's                            exam become available for review. (Suprep or Plenvu                             for next exam) Neely Kammerer L. Loletha Carrow, MD 06/18/2020 3:52:10 PM This report has been signed electronically.

## 2020-06-18 NOTE — Progress Notes (Signed)
Report given to PACU, vss 

## 2020-06-18 NOTE — Patient Instructions (Signed)
Handouts given for polyps, diverticulosis and hemorrhoids.  Await pathology results.  YOU HAD AN ENDOSCOPIC PROCEDURE TODAY AT THE [facility]:   Refer to the procedure report that was given to you for any specific questions about what was found during the examination.  If the procedure report does not answer your questions, please call your gastroenterologist to clarify.  If you requested that your care partner not be given the details of your procedure findings, then the procedure report has been included in a sealed envelope for you to review at your convenience later.  YOU SHOULD EXPECT: Some feelings of bloating in the abdomen. Passage of more gas than usual.  Walking can help get rid of the air that was put into your GI tract during the procedure and reduce the bloating. If you had a lower endoscopy (such as a colonoscopy or flexible sigmoidoscopy) you may notice spotting of blood in your stool or on the toilet paper. If you underwent a bowel prep for your procedure, you may not have a normal bowel movement for a few days.  Please Note:  You might notice some irritation and congestion in your nose or some drainage.  This is from the oxygen used during your procedure.  There is no need for concern and it should clear up in a day or so.  SYMPTOMS TO REPORT IMMEDIATELY:   Following lower endoscopy (colonoscopy or flexible sigmoidoscopy):  Excessive amounts of blood in the stool  Significant tenderness or worsening of abdominal pains  Swelling of the abdomen that is new, acute  Fever of 100F or higher   For urgent or emergent issues, a gastroenterologist can be reached at any hour by calling [phone]. Do not use MyChart messaging for urgent concerns.    DIET:  We do recommend a small meal at first, but then you may proceed to your regular diet.  Drink plenty of fluids but you should avoid alcoholic beverages for 24 hours.  ACTIVITY:  You should plan to take it easy for  the rest of today and you should NOT DRIVE or use heavy machinery until tomorrow (because of the sedation medicines used during the test).    FOLLOW UP: Our staff will call the number listed on your records 48-72 hours following your procedure to check on you and address any questions or concerns that you may have regarding the information given to you following your procedure. If we do not reach you, we will leave a message.  We will attempt to reach you two times.  During this call, we will ask if you have developed any symptoms of COVID 19. If you develop any symptoms (ie: fever, flu-like symptoms, shortness of breath, cough etc.) before then, please call [phone].  If you test positive for Covid 19 in the 2 weeks post procedure, please call and report this information to us.    If any biopsies were taken you will be contacted by phone or by letter within the next 1-3 weeks.  Please call us at [phone] if you have not heard about the biopsies in 3 weeks.    SIGNATURES/CONFIDENTIALITY: You and/or your care partner have signed paperwork which will be entered into your electronic medical record.  These signatures attest to the fact that that the information above on your After Visit Summary has been reviewed and is understood.  Full responsibility of the confidentiality of this discharge information lies with you and/or your care-partner. 

## 2020-06-18 NOTE — Progress Notes (Signed)
Called to room to assist during endoscopic procedure.  Patient ID and intended procedure confirmed with present staff. Received instructions for my participation in the procedure from the performing physician.  

## 2020-06-18 NOTE — Progress Notes (Signed)
Vs CW  Pt's states no medical or surgical changes since previsit or office visit.  

## 2020-06-20 ENCOUNTER — Telehealth: Payer: Self-pay

## 2020-06-20 NOTE — Telephone Encounter (Signed)
Called [phone] and the voice mail was not set up.  I was unable to leave a message we tried to reach pt for a follow up call. maw

## 2020-06-20 NOTE — Telephone Encounter (Signed)
Called [phone] and the voice mail was not set up.  I was unable to leave a message we tried to reach pt for follow-up. maw

## 2020-06-23 ENCOUNTER — Encounter: Payer: Self-pay | Admitting: Gastroenterology

## 2020-07-02 ENCOUNTER — Telehealth: Payer: Self-pay

## 2020-07-02 NOTE — Telephone Encounter (Signed)
  Incoming Patient Call  07/02/2020  What symptoms do you have? Hand pain that is waking him up at night  How long have you been sick? Almost two months  Have you been seen for this problem? Yes by Dr. Warrick Parisian, but it is getting worse  If your provider decides to give you a prescription, which pharmacy would you like for it to be sent to? [facility]    Patient informed that this information will be sent to the clinical staff for review and that they should receive a follow up call.

## 2020-07-02 NOTE — Telephone Encounter (Signed)
Appointment scheduled.

## 2020-07-02 NOTE — Telephone Encounter (Signed)
It looks like we already tried a short course of steroids during this time.,  Was prescribed on 05/28/2020, did not help at all?,  If it did not then we may want to do a referral to orthopedic or he can come in and get an injection, it does sound like carpal tunnel syndrome. Caryl Pina, MD [facility] 07/02/2020, 11:05 AM

## 2020-07-02 NOTE — Telephone Encounter (Signed)
Patient called back to state the tips of his ring,middle and thumb are numb and he cannot make a fist.  When he tries to work with it, the hand will swell.  It is the L hand.  Please let him know what he can do or maybe be referred somewhere.  Is unable to work like this.

## 2020-07-02 NOTE — Telephone Encounter (Signed)
Patient aware and states that the steroids did not help.  Would like to get injection.  Do you do the injections?

## 2020-07-02 NOTE — Telephone Encounter (Signed)
Yes I can do carpal tunnel injections, get him set up for it as soon as possible, if nothing is available then put him on a cancellation list

## 2020-07-02 NOTE — Telephone Encounter (Signed)
Seen [place] please advise  

## 2020-07-06 ENCOUNTER — Other Ambulatory Visit: Payer: Self-pay

## 2020-07-06 ENCOUNTER — Ambulatory Visit (INDEPENDENT_AMBULATORY_CARE_PROVIDER_SITE_OTHER): Payer: BC Managed Care – PPO | Admitting: Family Medicine

## 2020-07-06 ENCOUNTER — Encounter: Payer: Self-pay | Admitting: Family Medicine

## 2020-07-06 VITALS — BP 144/81 | HR 84 | Temp 97.0°F | Ht 71.0 in | Wt 218.0 lb

## 2020-07-06 DIAGNOSIS — G5602 Carpal tunnel syndrome, left upper limb: Secondary | ICD-10-CM | POA: Diagnosis not present

## 2020-07-06 MED ORDER — METHYLPREDNISOLONE ACETATE 40 MG/ML IJ SUSP: 40.0000 mg | Freq: Once | INTRAMUSCULAR | Status: DC

## 2020-07-06 NOTE — Progress Notes (Signed)
BP (!) 144/81   Pulse 84   Temp (!) 97 F (36.1 C)   Ht 5\' 11"  (1.803 m)   Wt 218 lb (98.9 kg)   SpO2 95%   BMI 30.40 kg/m    Subjective:   Patient ID: Keith Zimmerman, male    DOB: [birth year], 59 y.o.   MRN: [id number]  HPI: Keith Zimmerman is a 59 y.o. male presenting on 07/06/2020 for Wrist Pain and Hand Pain   HPI Patient is coming in for left wrist pain and numbness on the thumb and first couple fingers of the left hand.  He says he is also having swelling in there and feels like his grip strength is starting to be affected.  He has been fighting for this and trying the bracing and splinting and anti-inflammatories and that does not seem to be helping.  He feels like his been worsening especially over the past few months.  Relevant past medical, surgical, family and social history reviewed and updated as indicated. Interim medical history since our last visit reviewed. Allergies and medications reviewed and updated.  Review of Systems  Constitutional: Negative for chills and fever.  Eyes: Negative for visual disturbance.  Respiratory: Negative for shortness of breath and wheezing.   Cardiovascular: Negative for chest pain and leg swelling.  Musculoskeletal: Positive for arthralgias. Negative for back pain and gait problem.  Skin: Negative for rash.  Neurological: Positive for numbness.  All other systems reviewed and are negative.   Per HPI unless specifically indicated above   Allergies as of 07/06/2020      Reactions   Rhododendron Other (See Comments)   "blistering"      Medication List       Accurate as of July 06, 2020  3:57 PM. If you have any questions, ask your nurse or doctor.        STOP taking these medications   predniSONE 20 MG tablet Commonly known as: DELTASONE Stopped by: Fransisca Kaufmann Jonathandavid Marlett, MD     TAKE these medications   atorvastatin 10 MG tablet Commonly known as: LIPITOR Take 1 tablet (10 mg total) by mouth daily.     cyclobenzaprine 10 MG tablet Commonly known as: FLEXERIL Take 1 tablet (10 mg total) by mouth 3 (three) times daily as needed for muscle spasms.   Fish Oil 1000 MG Caps Take by mouth.   sildenafil 20 MG tablet Commonly known as: REVATIO Take 1-4 tablets (20-80 mg total) by mouth as needed.        Objective:   BP (!) 144/81   Pulse 84   Temp (!) 97 F (36.1 C)   Ht 5\' 11"  (1.803 m)   Wt 218 lb (98.9 kg)   SpO2 95%   BMI 30.40 kg/m   Wt Readings from Last 3 Encounters:  07/06/20 218 lb (98.9 kg)  06/18/20 217 lb (98.4 kg)  06/04/20 217 lb (98.4 kg)    Physical Exam Vitals and nursing note reviewed.  Constitutional:      General: He is not in acute distress.    Appearance: He is well-developed. He is not diaphoretic.  Eyes:     General: No scleral icterus.    Conjunctiva/sclera: Conjunctivae normal.  Neck:     Thyroid: No thyromegaly.  Musculoskeletal:        General: Normal range of motion.     Left wrist: Swelling and tenderness (Positive Tinel's sign, grip strength seems to be intact) present. No deformity.  Skin:  General: Skin is warm and dry.     Findings: No rash.  Neurological:     Mental Status: He is alert and oriented to person, place, and time.     Coordination: Coordination normal.  Psychiatric:        Behavior: Behavior normal.     Left carpal tunnel injection: Consent form signed. Risk factors of bleeding and infection discussed with patient and patient is agreeable towards injection. Patient prepped with Betadine. Lateral approach towards injection used. Injected 40 mg of Depo-Medrol and 1 mL of 2% lidocaine. Patient tolerated procedure well and no side effects from noted. Minimal to no bleeding. Simple bandage applied after.   Assessment & Plan:   Problem List Items Addressed This Visit    None    Visit Diagnoses    Left carpal tunnel syndrome    -  Primary   Relevant Medications   methylPREDNISolone acetate (DEPO-MEDROL) injection 40  mg       Follow up plan: Return if symptoms worsen or fail to improve.  Counseling provided for all of the vaccine components No orders of the defined types were placed in this encounter.   Caryl Pina, MD [facility] 07/06/2020, 3:57 PM

## 2020-09-17 ENCOUNTER — Other Ambulatory Visit: Payer: BC Managed Care – PPO

## 2020-09-17 DIAGNOSIS — Z20822 Contact with and (suspected) exposure to covid-19: Secondary | ICD-10-CM

## 2020-09-18 LAB — SARS-COV-2, NAA 2 DAY TAT

## 2020-09-18 LAB — NOVEL CORONAVIRUS, NAA: SARS-CoV-2, NAA: DETECTED — AB

## 2020-11-03 DIAGNOSIS — F17210 Nicotine dependence, cigarettes, uncomplicated: Secondary | ICD-10-CM | POA: Diagnosis not present

## 2020-11-03 DIAGNOSIS — R0789 Other chest pain: Secondary | ICD-10-CM | POA: Diagnosis not present

## 2020-11-03 DIAGNOSIS — R079 Chest pain, unspecified: Secondary | ICD-10-CM | POA: Diagnosis not present

## 2020-11-03 DIAGNOSIS — F419 Anxiety disorder, unspecified: Secondary | ICD-10-CM | POA: Diagnosis not present

## 2020-11-03 DIAGNOSIS — R0602 Shortness of breath: Secondary | ICD-10-CM | POA: Diagnosis not present

## 2020-11-03 DIAGNOSIS — R42 Dizziness and giddiness: Secondary | ICD-10-CM | POA: Diagnosis not present

## 2020-11-03 DIAGNOSIS — Z79899 Other long term (current) drug therapy: Secondary | ICD-10-CM | POA: Diagnosis not present

## 2020-11-03 DIAGNOSIS — R072 Precordial pain: Secondary | ICD-10-CM | POA: Diagnosis not present

## 2020-11-03 DIAGNOSIS — E785 Hyperlipidemia, unspecified: Secondary | ICD-10-CM | POA: Diagnosis not present

## 2021-02-25 ENCOUNTER — Encounter: Payer: Self-pay | Admitting: Family Medicine

## 2021-02-25 ENCOUNTER — Encounter: Payer: BC Managed Care – PPO | Admitting: Family Medicine

## 2021-04-09 ENCOUNTER — Other Ambulatory Visit: Payer: Self-pay | Admitting: Family Medicine

## 2021-04-09 DIAGNOSIS — E782 Mixed hyperlipidemia: Secondary | ICD-10-CM

## 2021-06-06 ENCOUNTER — Encounter: Payer: Self-pay | Admitting: Family

## 2021-06-06 ENCOUNTER — Ambulatory Visit (INDEPENDENT_AMBULATORY_CARE_PROVIDER_SITE_OTHER): Payer: BC Managed Care – PPO | Admitting: Family

## 2021-06-06 ENCOUNTER — Telehealth: Payer: Self-pay | Admitting: Family Medicine

## 2021-06-06 ENCOUNTER — Ambulatory Visit (INDEPENDENT_AMBULATORY_CARE_PROVIDER_SITE_OTHER): Payer: BC Managed Care – PPO

## 2021-06-06 ENCOUNTER — Other Ambulatory Visit: Payer: Self-pay

## 2021-06-06 VITALS — BP 139/85 | HR 79 | Temp 98.2°F | Ht 71.0 in | Wt 226.0 lb

## 2021-06-06 DIAGNOSIS — E782 Mixed hyperlipidemia: Secondary | ICD-10-CM | POA: Diagnosis not present

## 2021-06-06 DIAGNOSIS — R0781 Pleurodynia: Secondary | ICD-10-CM | POA: Diagnosis not present

## 2021-06-06 DIAGNOSIS — J301 Allergic rhinitis due to pollen: Secondary | ICD-10-CM

## 2021-06-06 DIAGNOSIS — H938X3 Other specified disorders of ear, bilateral: Secondary | ICD-10-CM

## 2021-06-06 MED ORDER — PREDNISONE 10 MG (21) PO TBPK: ORAL_TABLET | ORAL | 0 refills | Status: DC

## 2021-06-06 MED ORDER — ATORVASTATIN CALCIUM 10 MG PO TABS: 10.0000 mg | ORAL_TABLET | Freq: Every day | ORAL | 0 refills | Status: DC

## 2021-06-06 MED ORDER — FLUTICASONE PROPIONATE 50 MCG/ACT NA SUSP: 2.0000 | Freq: Every day | NASAL | 6 refills | Status: DC

## 2021-06-06 MED ORDER — DICLOFENAC SODIUM 75 MG PO TBEC: 75.0000 mg | DELAYED_RELEASE_TABLET | Freq: Two times a day (BID) | ORAL | 0 refills | Status: DC

## 2021-06-06 MED ORDER — CETIRIZINE HCL 10 MG PO TABS: 10.0000 mg | ORAL_TABLET | Freq: Every day | ORAL | 11 refills | Status: DC

## 2021-06-06 NOTE — Patient Instructions (Signed)
Eustachian Tube Dysfunction °Eustachian tube dysfunction refers to a condition in which a blockage develops in the narrow passage that connects the middle ear to the back of the nose (eustachian tube). The eustachian tube regulates air pressure in the middle ear by letting air move between the ear and nose. It also helps to drain fluid from the middle ear space. °Eustachian tube dysfunction can affect one or both ears. When the eustachian tube does not function properly, air pressure, fluid, or both can build up in the middle ear. °What are the causes? °This condition occurs when the eustachian tube becomes blocked or cannot open normally. Common causes of this condition include: °Ear infections. °Colds and other infections that affect the nose, mouth, and throat (upper respiratory tract). °Allergies. °Irritation from cigarette smoke. °Irritation from stomach acid coming up into the esophagus (gastroesophageal reflux). The esophagus is the part of the body that moves food from the mouth to the stomach. °Sudden changes in air pressure, such as from descending in an airplane or scuba diving. °Abnormal growths in the nose or throat, such as: °Growths that line the nose (nasal polyps). °Abnormal growth of cells (tumors). °Enlarged tissue at the back of the throat (adenoids). °What increases the risk? °You are more likely to develop this condition if: °You smoke. °You are overweight. °You are a child who has: °Certain birth defects of the mouth, such as cleft palate. °Large tonsils or adenoids. °What are the signs or symptoms? °Common symptoms of this condition include: °A feeling of fullness in the ear. °Ear pain. °Clicking or popping noises in the ear. °Ringing in the ear (tinnitus). °Hearing loss. °Loss of balance. °Dizziness. °Symptoms may get worse when the air pressure around you changes, such as when you travel to an area of high elevation, fly on an airplane, or go scuba diving. °How is this diagnosed? °This  condition may be diagnosed based on: °Your symptoms. °A physical exam of your ears, nose, and throat. °Tests, such as those that measure: °The movement of your eardrum. °Your hearing (audiometry). °How is this treated? °Treatment depends on the cause and severity of your condition. °In mild cases, you may relieve your symptoms by moving air into your ears. This is called "popping the ears." °In more severe cases, or if you have symptoms of fluid in your ears, treatment may include: °Medicines to relieve congestion (decongestants). °Medicines that treat allergies (antihistamines). °Nasal sprays or ear drops that contain medicines that reduce swelling (steroids). °A procedure to drain the fluid in your eardrum. In this procedure, a small tube may be placed in the eardrum to: °Drain the fluid. °Restore the air in the middle ear space. °A procedure to insert a balloon device through the nose to inflate the opening of the eustachian tube (balloon dilation). °Follow these instructions at home: °Lifestyle °Do not do any of the following until your health care provider approves: °Travel to high altitudes. °Fly in airplanes. °Work in a pressurized cabin or room. °Scuba dive. °Do not use any products that contain nicotine or tobacco. These products include cigarettes, chewing tobacco, and vaping devices, such as e-cigarettes. If you need help quitting, ask your health care provider. °Keep your ears dry. Wear fitted earplugs during showering and bathing. Dry your ears completely after. °General instructions °Take over-the-counter and prescription medicines only as told by your health care provider. °Use techniques to help pop your ears as recommended by your health care provider. These may include: °Chewing gum. °Yawning. °Frequent, forceful swallowing. °Closing   your mouth, holding your nose closed, and gently blowing as if you are trying to blow air out of your nose. °Keep all follow-up visits. This is important. °Contact a  health care provider if: °Your symptoms do not go away after treatment. °Your symptoms come back after treatment. °You are unable to pop your ears. °You have: °A fever. °Pain in your ear. °Pain in your head or neck. °Fluid draining from your ear. °Your hearing suddenly changes. °You become very dizzy. °You lose your balance. °Get help right away if: °You have a sudden, severe increase in any of your symptoms. °Summary °Eustachian tube dysfunction refers to a condition in which a blockage develops in the eustachian tube. °It can be caused by ear infections, allergies, inhaled irritants, or abnormal growths in the nose or throat. °Symptoms may include ear pain or fullness, hearing loss, or ringing in the ears. °Mild cases are treated with techniques to unblock the ears, such as yawning or chewing gum. °More severe cases are treated with medicines or procedures. °This information is not intended to replace advice given to you by your health care provider. Make sure you discuss any questions you have with your health care provider. °Document Revised: 11/12/2020 Document Reviewed: 11/12/2020 °Elsevier Patient Education © 2022 [employer] ° °

## 2021-06-06 NOTE — Progress Notes (Signed)
Subjective:    Patient ID: Keith Zimmerman, male    DOB: 1961/01/19, 60 y.o.   MRN: [id number]  Chief Complaint  Patient presents with   Ear Problem    Ear feels like they are popping pressure in neck. Things seem blurry when he works.left said pain x 42mths.    Pt presents to the office today with bilateral ear fullness that has been on going for 6 months. He also is complaining of left rib pain that comes and goes for the last 6 months.States the pain is 1-2 out 10. 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 He has not taken anything.   Ear Fullness  There is pain in both ears. This is a chronic problem. The current episode started more than 1 month ago. There has been no fever. Associated symptoms include hearing loss. Pertinent negatives include no abdominal pain, coughing, ear discharge, headaches, rhinorrhea or sore throat. He has tried nothing for the symptoms. The treatment provided no relief.     Review of Systems  HENT:  Positive for hearing loss. Negative for ear discharge, rhinorrhea and sore throat.   Respiratory:  Negative for cough.   Gastrointestinal:  Negative for abdominal pain.  Neurological:  Negative for headaches.  All other systems reviewed and are negative.     Objective:   Physical Exam Vitals reviewed.  Constitutional:      General: He is not in acute distress.    Appearance: He is well-developed.  HENT:     Head: Normocephalic.     Left Ear: A middle ear effusion is present.  Eyes:     General:        Right eye: No discharge.        Left eye: No discharge.     Pupils: Pupils are equal, round, and reactive to light.  Neck:     Thyroid: No thyromegaly.  Cardiovascular:     Rate and Rhythm: Normal rate and regular rhythm.     Heart sounds: Normal heart sounds. No murmur heard. Pulmonary:     Effort: Pulmonary effort is normal. No respiratory distress.     Breath sounds: Normal breath sounds. No  wheezing.  Abdominal:     General: Bowel sounds are normal. There is no distension.     Palpations: Abdomen is soft.     Tenderness: There is no abdominal tenderness.  Musculoskeletal:        General: No tenderness. Normal range of motion.     Cervical back: Normal range of motion and neck supple.  Skin:    General: Skin is warm and dry.     Findings: No erythema or rash.  Neurological:     Mental Status: He is alert and oriented to person, place, and time.     Cranial Nerves: No cranial nerve deficit.     Deep Tendon Reflexes: Reflexes are normal and symmetric.  Psychiatric:        Behavior: Behavior normal.        Thought Content: Thought content normal.        Judgment: Judgment normal.      BP 139/85   Pulse 79   Temp 98.2 F (36.8 C) (Temporal)   Ht 5\' 11"  (1.803 m)   Wt 226 lb (102.5 kg)   BMI 31.52 kg/m      Assessment & Plan:  Keith Zimmerman comes in today with chief complaint of Ear  Problem (Ear feels like they are popping pressure in neck. Things seem blurry when he works.left said pain x 45mths. )   Diagnosis and orders addressed:  1. Sensation of fullness in both ears - diclofenac (VOLTAREN) 75 MG EC tablet; Take 1 tablet (75 mg total) by mouth 2 (two) times daily.  Dispense: 30 tablet; Refill: 0  2. Rib pain on left side Rest Diclofenac and prednisone  No other NSAID's - DG Ribs Unilateral W/Chest Left - diclofenac (VOLTAREN) 75 MG EC tablet; Take 1 tablet (75 mg total) by mouth 2 (two) times daily.  Dispense: 30 tablet; Refill: 0 - predniSONE (STERAPRED UNI-PAK 21 TAB) 10 MG (21) TBPK tablet; Use as directed  Dispense: 21 tablet; Refill: 0  3. Allergic rhinitis due to pollen, unspecified seasonality Start zyrtec and flonase - cetirizine (ZYRTEC) 10 MG tablet; Take 1 tablet (10 mg total) by mouth daily.  Dispense: 30 tablet; Refill: 11 - fluticasone (FLONASE) 50 MCG/ACT nasal spray; Place 2 sprays into both nostrils daily.  Dispense: 16 g; Refill:  [place], FNP

## 2021-08-31 ENCOUNTER — Other Ambulatory Visit: Payer: Self-pay | Admitting: Family

## 2021-08-31 DIAGNOSIS — E782 Mixed hyperlipidemia: Secondary | ICD-10-CM

## 2021-09-03 ENCOUNTER — Encounter (HOSPITAL_BASED_OUTPATIENT_CLINIC_OR_DEPARTMENT_OTHER): Payer: Self-pay

## 2021-09-03 ENCOUNTER — Emergency Department (HOSPITAL_BASED_OUTPATIENT_CLINIC_OR_DEPARTMENT_OTHER): Payer: BC Managed Care – PPO

## 2021-09-03 ENCOUNTER — Emergency Department (HOSPITAL_BASED_OUTPATIENT_CLINIC_OR_DEPARTMENT_OTHER): Payer: BC Managed Care – PPO | Admitting: Radiology

## 2021-09-03 ENCOUNTER — Emergency Department (HOSPITAL_BASED_OUTPATIENT_CLINIC_OR_DEPARTMENT_OTHER)
Admission: EM | Admit: 2021-09-03 | Discharge: 2021-09-03 | Disposition: A | Discharge status: Home / Self Care | Payer: BC Managed Care – PPO | Attending: Emergency Medicine | Admitting: Emergency Medicine

## 2021-09-03 ENCOUNTER — Other Ambulatory Visit: Payer: Self-pay

## 2021-09-03 DIAGNOSIS — M2578 Osteophyte, vertebrae: Secondary | ICD-10-CM | POA: Diagnosis not present

## 2021-09-03 DIAGNOSIS — Y92410 Unspecified street and highway as the place of occurrence of the external cause: Secondary | ICD-10-CM | POA: Diagnosis not present

## 2021-09-03 DIAGNOSIS — M4802 Spinal stenosis, cervical region: Secondary | ICD-10-CM | POA: Diagnosis not present

## 2021-09-03 DIAGNOSIS — S0990XA Unspecified injury of head, initial encounter: Secondary | ICD-10-CM | POA: Diagnosis not present

## 2021-09-03 DIAGNOSIS — Z87891 Personal history of nicotine dependence: Secondary | ICD-10-CM | POA: Insufficient documentation

## 2021-09-03 DIAGNOSIS — M79661 Pain in right lower leg: Secondary | ICD-10-CM | POA: Diagnosis not present

## 2021-09-03 DIAGNOSIS — S81811A Laceration without foreign body, right lower leg, initial encounter: Secondary | ICD-10-CM | POA: Insufficient documentation

## 2021-09-03 DIAGNOSIS — M7989 Other specified soft tissue disorders: Secondary | ICD-10-CM | POA: Diagnosis not present

## 2021-09-03 DIAGNOSIS — M79604 Pain in right leg: Secondary | ICD-10-CM

## 2021-09-03 DIAGNOSIS — S8991XA Unspecified injury of right lower leg, initial encounter: Secondary | ICD-10-CM | POA: Diagnosis not present

## 2021-09-03 DIAGNOSIS — J9811 Atelectasis: Secondary | ICD-10-CM | POA: Diagnosis not present
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
## 2021-09-03 NOTE — ED Notes (Signed)
Pt verbalizes understanding of discharge instructions. Opportunity for questioning and answers were provided. Pt discharged from ED to home.   ? ?
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
## 2021-09-13 ENCOUNTER — Other Ambulatory Visit: Payer: Self-pay | Admitting: Family Medicine

## 2021-09-13 ENCOUNTER — Other Ambulatory Visit: Payer: Self-pay

## 2021-09-13 ENCOUNTER — Encounter: Payer: Self-pay | Admitting: Family Medicine

## 2021-09-13 ENCOUNTER — Ambulatory Visit (INDEPENDENT_AMBULATORY_CARE_PROVIDER_SITE_OTHER): Payer: BC Managed Care – PPO | Admitting: Family Medicine

## 2021-09-13 ENCOUNTER — Ambulatory Visit (HOSPITAL_COMMUNITY)
Admission: RE | Admit: 2021-09-13 | Discharge: 2021-09-13 | Disposition: A | Discharge status: Home / Self Care | Payer: BC Managed Care – PPO | Source: Ambulatory Visit | Attending: Family Medicine | Admitting: Family Medicine

## 2021-09-13 DIAGNOSIS — S161XXA Strain of muscle, fascia and tendon at neck level, initial encounter: Secondary | ICD-10-CM

## 2021-09-13 DIAGNOSIS — R221 Localized swelling, mass and lump, neck: Secondary | ICD-10-CM

## 2021-09-13 DIAGNOSIS — R220 Localized swelling, mass and lump, head: Secondary | ICD-10-CM | POA: Diagnosis not present

## 2021-09-13 DIAGNOSIS — S161XXD Strain of muscle, fascia and tendon at neck level, subsequent encounter: Secondary | ICD-10-CM
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
## 2021-11-01 ENCOUNTER — Ambulatory Visit (HOSPITAL_COMMUNITY): Admission: RE | Admit: 2021-11-01 | Payer: BC Managed Care – PPO | Source: Ambulatory Visit

## 2021-12-07 ENCOUNTER — Other Ambulatory Visit: Payer: Self-pay | Admitting: Family Medicine

## 2021-12-07 DIAGNOSIS — E782 Mixed hyperlipidemia: Secondary | ICD-10-CM

## 2021-12-22 ENCOUNTER — Other Ambulatory Visit: Payer: Self-pay | Admitting: Family Medicine

## 2021-12-22 DIAGNOSIS — E782 Mixed hyperlipidemia: Secondary | ICD-10-CM

## 2021-12-23 NOTE — Addendum Note (Signed)
Addended by: Lanier Prude D on: 12/23/2021 12:18 PM ? ? Modules accepted: Orders ? ?

## 2021-12-23 NOTE — Telephone Encounter (Signed)
Appointment scheduled for 02/12/22 ?

## 2021-12-23 NOTE — Telephone Encounter (Signed)
30 days given 12/09/2021 - ntbs ?

## 2021-12-23 NOTE — Telephone Encounter (Signed)
Sent in refill request as approved - this was an error as patient needs to be seen be seen before further refills.  Called pharmacy and canceled the RX.  ?

## 2022-01-15 ENCOUNTER — Other Ambulatory Visit: Payer: Self-pay | Admitting: Family Medicine

## 2022-01-15 DIAGNOSIS — E782 Mixed hyperlipidemia: Secondary | ICD-10-CM

## 2022-02-12 ENCOUNTER — Encounter: Payer: Self-pay | Admitting: Family Medicine

## 2022-02-12 ENCOUNTER — Ambulatory Visit (INDEPENDENT_AMBULATORY_CARE_PROVIDER_SITE_OTHER): Payer: BC Managed Care – PPO | Admitting: Family Medicine

## 2022-02-12 VITALS — BP 128/76 | HR 63 | Temp 98.3°F | Ht 71.0 in | Wt 222.0 lb

## 2022-02-12 DIAGNOSIS — G5602 Carpal tunnel syndrome, left upper limb: Secondary | ICD-10-CM

## 2022-02-12 DIAGNOSIS — Z125 Encounter for screening for malignant neoplasm of prostate: Secondary | ICD-10-CM | POA: Diagnosis not present

## 2022-02-12 DIAGNOSIS — E782 Mixed hyperlipidemia: Secondary | ICD-10-CM

## 2022-02-12 DIAGNOSIS — Z131 Encounter for screening for diabetes mellitus: Secondary | ICD-10-CM | POA: Diagnosis not present

## 2022-02-12 DIAGNOSIS — H938X3 Other specified disorders of ear, bilateral: Secondary | ICD-10-CM

## 2022-02-12 MED ORDER — ATORVASTATIN CALCIUM 10 MG PO TABS: 10.0000 mg | ORAL_TABLET | Freq: Every day | ORAL | 3 refills | Status: DC

## 2022-02-12 NOTE — Progress Notes (Signed)
BP 128/76   Pulse 63   Temp 98.3 F (36.8 C)   Ht '5\' 11"'  (1.803 m)   Wt 222 lb (100.7 kg)   SpO2 96%   BMI 30.96 kg/m    Subjective:   Patient ID: Keith Zimmerman, male    DOB: [birth year], 61 y.o.   MRN: [id number]  HPI: Keith Zimmerman is a 61 y.o. male presenting on 02/12/2022 for Medical Management of Chronic Issues and Hyperlipidemia   HPI Hyperlipidemia Patient is coming in for recheck of his hyperlipidemia. The patient is currently taking atorvastatin. They deny any issues with myalgias or history of liver damage from it. They deny any focal numbness or weakness or chest pain.   He does get some arthralgias in his hands that come and go but he also gets some numbness in his left hand similar to carpal tunnel, he is going to try some anti-inflammatories first and then we may need doing injections in the future likely done for his other hand.  Patient says he gets some ear popping and then after his ears pop he can hear good but he does feel like there is some congestion or pressure in there.  This been going off and on for quite some time.  Patient did quit smoking about 8 months ago.  Relevant past medical, surgical, family and social history reviewed and updated as indicated. Interim medical history since our last visit reviewed. Allergies and medications reviewed and updated.  Review of Systems  Constitutional:  Negative for chills and fever.  Eyes:  Negative for visual disturbance.  Respiratory:  Negative for shortness of breath and wheezing.   Cardiovascular:  Negative for chest pain and leg swelling.  Musculoskeletal:  Negative for back pain and gait problem.  Skin:  Negative for rash.  Neurological:  Negative for dizziness, weakness and light-headedness.  All other systems reviewed and are negative.  Per HPI unless specifically indicated above   Allergies as of 02/12/2022       Reactions   Rhododendron Other (See Comments)   "blistering"         Medication List        Accurate as of Feb 12, 2022 10:58 AM. If you have any questions, ask your nurse or doctor.          atorvastatin 10 MG tablet Commonly known as: LIPITOR Take 1 tablet (10 mg total) by mouth daily.         Objective:   BP 128/76   Pulse 63   Temp 98.3 F (36.8 C)   Ht '5\' 11"'  (1.803 m)   Wt 222 lb (100.7 kg)   SpO2 96%   BMI 30.96 kg/m   Wt Readings from Last 3 Encounters:  02/12/22 222 lb (100.7 kg)  09/13/21 216 lb (98 kg)  06/06/21 226 lb (102.5 kg)    Physical Exam Vitals and nursing note reviewed.  Constitutional:      General: He is not in acute distress.    Appearance: He is well-developed. He is not diaphoretic.  Eyes:     General: No scleral icterus.    Conjunctiva/sclera: Conjunctivae normal.  Neck:     Thyroid: No thyromegaly.  Cardiovascular:     Rate and Rhythm: Normal rate and regular rhythm.     Heart sounds: Normal heart sounds. No murmur heard. Pulmonary:     Effort: Pulmonary effort is normal. No respiratory distress.     Breath sounds: Normal breath sounds. No wheezing.  Musculoskeletal:  General: No swelling. Normal range of motion.     Cervical back: Neck supple.     Comments: Positive Tinel sign left hand  Lymphadenopathy:     Cervical: No cervical adenopathy.  Skin:    General: Skin is warm and dry.     Findings: No rash.  Neurological:     Mental Status: He is alert and oriented to person, place, and time.     Coordination: Coordination normal.  Psychiatric:        Behavior: Behavior normal.      Assessment & Plan:   Problem List Items Addressed This Visit       Other   Mixed dyslipidemia - Primary   Relevant Medications   atorvastatin (LIPITOR) 10 MG tablet   Other Relevant Orders   CMP14+EGFR   Lipid panel   Other Visit Diagnoses     Prostate cancer screening       Relevant Orders   PSA, total and free   Diabetes mellitus screening       Relevant Orders   CBC with  Differential/Platelet   CMP14+EGFR   Carpal tunnel syndrome on left       Ear congestion, bilateral         Recommended Flonase for 2 weeks and if not improved on his ear congestion the may consider ENT in the future.  For carpal tunnel recommended to take ibuprofen for couple weeks and then if not improved from there then we can consider injections for it.  Follow up plan: Return in about 1 year (around 02/13/2023), or if symptoms worsen or fail to improve, for Cholesterol recheck.  Counseling provided for all of the vaccine components Orders Placed This Encounter  Procedures   CBC with Differential/Platelet   CMP14+EGFR   Lipid panel   PSA, total and free    Caryl Pina, MD [facility] 02/12/2022, 10:58 AM

## 2022-02-13 LAB — CMP14+EGFR
ALT: 39 IU/L (ref 0–44)
AST: 18 IU/L (ref 0–40)
Albumin/Globulin Ratio: 2.2 (ref 1.2–2.2)
Albumin: 4.7 g/dL (ref 3.8–4.9)
Alkaline Phosphatase: 104 IU/L (ref 44–121)
BUN/Creatinine Ratio: 20 (ref 9–20)
BUN: 20 mg/dL (ref 6–24)
Bilirubin Total: 1 mg/dL (ref 0.0–1.2)
CO2: 22 mmol/L (ref 20–29)
Calcium: 9.9 mg/dL (ref 8.7–10.2)
Chloride: 103 mmol/L (ref 96–106)
Creatinine, Ser: 1.01 mg/dL (ref 0.76–1.27)
Globulin, Total: 2.1 g/dL (ref 1.5–4.5)
Glucose: 240 mg/dL — ABNORMAL HIGH (ref 70–99)
Potassium: 4.5 mmol/L (ref 3.5–5.2)
Sodium: 137 mmol/L (ref 134–144)
Total Protein: 6.8 g/dL (ref 6.0–8.5)
eGFR: 86 mL/min/{1.73_m2} (ref >59)

## 2022-02-13 LAB — CBC WITH DIFFERENTIAL/PLATELET
Basophils Absolute: 0.1 10*3/uL (ref 0.0–0.2)
Basos: 1 %
EOS (ABSOLUTE): 0.4 10*3/uL (ref 0.0–0.4)
Eos: 5 %
Hematocrit: 44 % (ref 37.5–51.0)
Hemoglobin: 15.4 g/dL (ref 13.0–17.7)
Immature Grans (Abs): 0.1 10*3/uL (ref 0.0–0.1)
Immature Granulocytes: 1 %
Lymphocytes Absolute: 1.8 10*3/uL (ref 0.7–3.1)
Lymphs: 23 %
MCH: 30.4 pg (ref 26.6–33.0)
MCHC: 35 g/dL (ref 31.5–35.7)
MCV: 87 fL (ref 79–97)
Monocytes Absolute: 0.6 10*3/uL (ref 0.1–0.9)
Monocytes: 8 %
Neutrophils Absolute: 4.8 10*3/uL (ref 1.4–7.0)
Neutrophils: 62 %
Platelets: 191 10*3/uL (ref 150–450)
RBC: 5.06 x10E6/uL (ref 4.14–5.80)
RDW: 12.8 % (ref 11.6–15.4)
WBC: 7.8 10*3/uL (ref 3.4–10.8)

## 2022-02-13 LAB — PSA, TOTAL AND FREE
PSA, Free Pct: 35 %
PSA, Free: 0.42 ng/mL
Prostate Specific Ag, Serum: 1.2 ng/mL (ref 0.0–4.0)

## 2022-02-13 LAB — LIPID PANEL
Chol/HDL Ratio: 5.9 ratio — ABNORMAL HIGH (ref 0.0–5.0)
Cholesterol, Total: 246 mg/dL — ABNORMAL HIGH (ref 100–199)
HDL: 42 mg/dL (ref >39)
LDL Chol Calc (NIH): 150 mg/dL — ABNORMAL HIGH (ref 0–99)
Triglycerides: 293 mg/dL — ABNORMAL HIGH (ref 0–149)
VLDL Cholesterol Cal: 54 mg/dL — ABNORMAL HIGH (ref 5–40)
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
## 2022-05-14 ENCOUNTER — Telehealth: Payer: Self-pay

## 2022-05-14 NOTE — Chronic Care Management (AMB) (Signed)
  Care Coordination  Note  05/14/2022 Name: [name] MRN: [id number] DOB: Feb 14, 1961  Keith Zimmerman is a 61 y.o. year old male who is a primary care patient of Dettinger, Fransisca Kaufmann, MD. I reached out to Juanda Chance by phone today to offer care coordination services.      [name] was given information about Care Coordination services today including:  The Care Coordination services include support from the care team which includes your Nurse Coordinator, Clinical Social Worker, or Pharmacist.  The Care Coordination team is here to help remove barriers to the health concerns and goals most important to you. Care Coordination services are voluntary and the patient may decline or stop services at any time by request to their care team member.   Patient did not agree to participate in care coordination services at this time.  Follow up plan: Patient declines further follow up or participation in care coordination services.   Noreene Larsson, [place],  [address] Direct Dial: [phone] [name]

## 2022-06-11 ENCOUNTER — Encounter: Payer: Self-pay | Admitting: Family Medicine

## 2022-06-11 ENCOUNTER — Ambulatory Visit (INDEPENDENT_AMBULATORY_CARE_PROVIDER_SITE_OTHER): Payer: BC Managed Care – PPO | Admitting: Family Medicine

## 2022-06-11 VITALS — BP 139/73 | HR 78 | Temp 98.7°F | Ht 71.0 in | Wt 215.0 lb

## 2022-06-11 DIAGNOSIS — K429 Umbilical hernia without obstruction or gangrene: Secondary | ICD-10-CM

## 2022-06-11 DIAGNOSIS — R739 Hyperglycemia, unspecified: Secondary | ICD-10-CM

## 2022-06-11 LAB — BAYER DCA HB A1C WAIVED: HB A1C (BAYER DCA - WAIVED): 12.4 % — ABNORMAL HIGH (ref 4.8–5.6)
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
## 2022-06-13 ENCOUNTER — Ambulatory Visit (INDEPENDENT_AMBULATORY_CARE_PROVIDER_SITE_OTHER): Payer: BC Managed Care – PPO | Admitting: Family Medicine

## 2022-06-13 ENCOUNTER — Encounter: Payer: Self-pay | Admitting: Family Medicine

## 2022-06-13 ENCOUNTER — Ambulatory Visit: Payer: BC Managed Care – PPO | Admitting: Family Medicine

## 2022-06-13 VITALS — BP 139/80 | HR 82 | Temp 98.4°F | Ht 71.0 in | Wt 214.0 lb

## 2022-06-13 DIAGNOSIS — E119 Type 2 diabetes mellitus without complications: Secondary | ICD-10-CM

## 2022-06-13 MED ORDER — METFORMIN HCL 500 MG PO TABS: 500.0000 mg | ORAL_TABLET | Freq: Two times a day (BID) | ORAL | 3 refills | Status: DC

## 2022-06-13 NOTE — Progress Notes (Signed)
BP 139/80   Pulse 82   Temp 98.4 F (36.9 C)   Ht '5\' 11"'$  (1.803 m)   Wt 214 lb (97.1 kg)   SpO2 94%   BMI 29.85 kg/m    Subjective:   Patient ID: Keith Zimmerman, male    DOB: December 24, 1960, 61 y.o.   MRN: [id number]  HPI: Keith Zimmerman is a 61 y.o. male presenting on 06/13/2022 for Medical Management of Chronic Issues and elevated blood sugar and A1C   HPI New onset diabetes Patient comes in today for recheck of his diabetes. Patient has been currently taking no medication, this is new onset, his A1c was 12.4.  Emergency had some dietary issues.. Patient is not currently on an ACE inhibitor/ARB. Patient has not seen an ophthalmologist this year. Patient denies any issues with their feet. The symptom started onset as an adult hyperlipidemia ARE RELATED TO DM   Relevant past medical, surgical, family and social history reviewed and updated as indicated. Interim medical history since our last visit reviewed. Allergies and medications reviewed and updated.  Review of Systems  Constitutional:  Negative for chills and fever.  Respiratory:  Negative for shortness of breath and wheezing.   Cardiovascular:  Negative for chest pain and leg swelling.  Endocrine: Positive for polydipsia and polyuria.  Musculoskeletal:  Negative for back pain and gait problem.  Skin:  Negative for rash.  All other systems reviewed and are negative.   Per HPI unless specifically indicated above   Allergies as of 06/13/2022       Reactions   Rhododendron Other (See Comments)   "blistering"        Medication List        Accurate as of June 13, 2022  3:48 PM. If you have any questions, ask your nurse or doctor.          atorvastatin 10 MG tablet Commonly known as: LIPITOR Take 1 tablet (10 mg total) by mouth daily.   metFORMIN 500 MG tablet Commonly known as: GLUCOPHAGE Take 1 tablet (500 mg total) by mouth 2 (two) times daily with a meal. Started by: Fransisca Kaufmann Dajaun Goldring, MD          Objective:   BP 139/80   Pulse 82   Temp 98.4 F (36.9 C)   Ht '5\' 11"'$  (1.803 m)   Wt 214 lb (97.1 kg)   SpO2 94%   BMI 29.85 kg/m   Wt Readings from Last 3 Encounters:  06/13/22 214 lb (97.1 kg)  06/11/22 215 lb (97.5 kg)  02/12/22 222 lb (100.7 kg)    Physical Exam Vitals and nursing note reviewed.  Constitutional:      General: He is not in acute distress.    Appearance: He is well-developed. He is not diaphoretic.  Eyes:     General: No scleral icterus.    Conjunctiva/sclera: Conjunctivae normal.  Neck:     Thyroid: No thyromegaly.  Cardiovascular:     Rate and Rhythm: Normal rate and regular rhythm.     Heart sounds: Normal heart sounds. No murmur heard. Pulmonary:     Effort: Pulmonary effort is normal. No respiratory distress.     Breath sounds: Normal breath sounds. No wheezing.  Musculoskeletal:        General: Normal range of motion.     Cervical back: Neck supple.  Lymphadenopathy:     Cervical: No cervical adenopathy.  Skin:    General: Skin is warm and dry.  Findings: No rash.  Neurological:     Mental Status: He is alert and oriented to person, place, and time.     Coordination: Coordination normal.  Psychiatric:        Behavior: Behavior normal.     Results for orders placed or performed in visit on 06/11/22  Bayer DCA Hb A1c Waived  Result Value Ref Range   HB A1C (BAYER DCA - WAIVED) 12.4 (H) 4.8 - 5.6 %    Assessment & Plan:   Problem List Items Addressed This Visit   None Visit Diagnoses     New onset type 2 diabetes mellitus ([place])    -  Primary   Relevant Medications   metFORMIN (GLUCOPHAGE) 500 MG tablet     Patient will start on metformin, sounds like he is already made a lot of dietary changes, will also set up with [facility] for diabetic education.  Follow up plan: Return in about 3 months (around 09/12/2022), or if symptoms worsen or fail to improve, for Diabetes recheck with me, also get an appointment with [facility] for  diabetes education sooner.  Counseling provided for all of the vaccine components No orders of the defined types were placed in this encounter.   Caryl Pina, MD [facility] 06/13/2022, 3:48 PM

## 2022-07-01 ENCOUNTER — Ambulatory Visit (INDEPENDENT_AMBULATORY_CARE_PROVIDER_SITE_OTHER): Payer: BC Managed Care – PPO | Admitting: Pharmacist

## 2022-07-01 DIAGNOSIS — E119 Type 2 diabetes mellitus without complications: Secondary | ICD-10-CM | POA: Diagnosis not present

## 2022-07-01 DIAGNOSIS — E782 Mixed hyperlipidemia: Secondary | ICD-10-CM | POA: Diagnosis not present

## 2022-07-01 NOTE — Progress Notes (Signed)
    07/01/2022 Name: Keith Zimmerman MRN: [id number] DOB: 11/08/1960   S:  7 yoM Presents for T2 diabetes evaluation, education, and management.  Patient was referred and last seen by Primary Care Provider on 06/11/22 where he was diagnosed with new onset type 2 diabetes.  He quit smoking over 1 year ago and has replaced his habit with food (not healthy choices).  He has since then made healthy food and lifestyle changes.  He has been on metformin and atorvastatin for 3 weeks now and is tolerating them well.  Patient reports needing upcoming hernia surgery and would like to get his sugar controlled for surgery before the new year.  Insurance coverage/medication affordability: BCBS commercial  Patient reports adherence with medications. Current diabetes medications include: metformin Current hypertension medications include: n/a Goal 130/80 Current hyperlipidemia medications include: atorva LDL 151 on 06/11/2022 STATIN ALLERGY/INTOLERANCE DOCUMENTED IN EMR n/a    Patient denies hypoglycemic events.   Patient reported dietary habits: Eats 3 meals/day Patient is working to limit his carbs to 40-50 3x times daily (3 meals per day) He is active He reports increased water intake  Patient-reported exercise habits: ACTIVE AT WORK   O:  Lab Results  Component Value Date   HGBA1C 12.4 (H) 06/11/2022    Lipid Panel     Component Value Date/Time   CHOL 246 (H) 02/12/2022 1107   TRIG 293 (H) 02/12/2022 1107   HDL 42 02/12/2022 1107   CHOLHDL 5.9 (H) 02/12/2022 1107   LDLCALC 150 (H) 02/12/2022 1107     Home fasting blood sugars: educated patient on when to check sugar --fasting in the AM or if symptomatic  2 hour post-meal/random blood sugars: n/a.    Clinical Atherosclerotic Cardiovascular Disease (ASCVD): No   The 10-year ASCVD risk score (Arnett DK, et al., 2019) is: 36.5%   Values used to calculate the score:     Age: 61 years     Sex: Male     Is Non-Hispanic African  American: No     Diabetic: Yes     Tobacco smoker: Yes     Systolic Blood Pressure: 470 mmHg     Is BP treated: No     HDL Cholesterol: 42 mg/dL     Total Cholesterol: 246 mg/dL    A/P:  Diabetes T2DM, gylcemis control appears to be improving.  1 hr post prandial glucose was 133 today.  Patient would like take additional steps to get his blood sugar controlled to have surgery sooner (before the 2024)  -CONTINUE metformin '500mg'$  twice daily  GFR 86  -START Farxiga '10mg'$  daily  5 WEEKS OF SAMPLES GIVEN   RECHECK A1C IN 2 MONTHS   -Extensively discussed pathophysiology of diabetes, recommended lifestyle interventions, dietary effects on blood sugar control  -Counseled on s/sx of and management of hypoglycemia  -Next A1C anticipated 08/05/2022    Written patient instructions provided.  Total time in face to face counseling 30 minutes.   Follow up [facility] Visit ON 08/05/2022   Regina Eck, PharmD, BCPS Clinical Pharmacist, [place]  II Phone [phone]

## 2022-07-21 ENCOUNTER — Telehealth: Payer: Self-pay | Admitting: Family Medicine

## 2022-07-21 MED ORDER — CONTOUR NEXT ONE DEVI: 0 refills | Status: AC

## 2022-07-21 NOTE — Telephone Encounter (Signed)
Pt aware requested rx for meter sent to pharmacy.

## 2022-08-05 ENCOUNTER — Telehealth: Payer: Self-pay | Admitting: Pharmacist

## 2022-08-05 ENCOUNTER — Ambulatory Visit (INDEPENDENT_AMBULATORY_CARE_PROVIDER_SITE_OTHER): Payer: BC Managed Care – PPO | Admitting: Pharmacist

## 2022-08-05 ENCOUNTER — Encounter: Payer: Self-pay | Admitting: Pharmacist

## 2022-08-05 VITALS — Wt 196.0 lb

## 2022-08-05 DIAGNOSIS — E119 Type 2 diabetes mellitus without complications: Secondary | ICD-10-CM | POA: Diagnosis not present

## 2022-08-05 LAB — BAYER DCA HB A1C WAIVED: HB A1C (BAYER DCA - WAIVED): 6.8 % — ABNORMAL HIGH (ref 4.8–5.6)

## 2022-08-05 MED ORDER — DAPAGLIFLOZIN PROPANEDIOL 10 MG PO TABS: 10.0000 mg | ORAL_TABLET | Freq: Every day | ORAL | 5 refills | Status: DC

## 2022-08-05 NOTE — Telephone Encounter (Signed)
Patient needs to schedule hernia repair before the end of year A1c now at goal 6.8% Please f/u to assist patient in scheduling/referral

## 2022-08-05 NOTE — Progress Notes (Signed)
Expand All Collapse All      08/05/2022 Name: Keith Zimmerman          MRN: [id number]       DOB: 07/20/1961     S:  61 yoM Presents for T2 diabetes evaluation, education, and management.  Patient was referred and last seen by Primary Care Provider on 06/11/22 where he was diagnosed with new onset type 2 diabetes.  He quit smoking over 1 year ago and has replaced his habit with food (not healthy choices).  He has since then made healthy food and lifestyle changes.  He has been on metformin and atorvastatin for 3 weeks now and is tolerating them well.  Patient reports needing upcoming hernia surgery and would like to get his sugar controlled for surgery before the new year.   Insurance coverage/medication affordability: BCBS commercial   Patient reports adherence with medications. Current diabetes medications include: metformin,farxiga Current hypertension medications include: n/a Goal 130/80 Current hyperlipidemia medications include: atorva LDL 151 on 06/11/2022 STATIN ALLERGY/INTOLERANCE DOCUMENTED IN EMR n/a     Patient denies hypoglycemic events.   Patient reported dietary habits: Eats 3 meals/day Patient is working to limit his carbs to 40-50 3x times daily (3 meals per day) He is active He reports increased water intake   Patient-reported exercise habits: ACTIVE AT WORK     O:   Recent Labs       Lab Results  Component Value Date    HGBA1C 12.4 (H) 06/11/2022        Lipid Panel   Labs (Brief)          Component Value Date/Time    CHOL 246 (H) 02/12/2022 1107    TRIG 293 (H) 02/12/2022 1107    HDL 42 02/12/2022 1107    CHOLHDL 5.9 (H) 02/12/2022 1107    LDLCALC 150 (H) 02/12/2022 1107         Home fasting blood sugars:  Past 7  108 Past 14 120 Past 30 113 Past 90 113   2 hour post-meal/random blood sugars: n/a.     Clinical Atherosclerotic Cardiovascular Disease (ASCVD): No    The 10-year ASCVD risk score (Arnett DK, et al., 2019) is:  36.5%   Values used to calculate the score:     Age: 61 years     Sex: Male     Is Non-Hispanic African American: No     Diabetic: Yes     Tobacco smoker: Yes     Systolic Blood Pressure: 638 mmHg     Is BP treated: No     HDL Cholesterol: 42 mg/dL     Total Cholesterol: 246 mg/dL     A/P:   Diabetes T2DM, gylcemis control appears to be improving.  1 hr post prandial glucose was 133 today.  Patient would like take additional steps to get his blood sugar controlled to have surgery sooner (before the 2024).  Reports no nocturia. His diet and    -CONTINUE metformin '500mg'$  twice daily             GFR 86   -CONTINUE Farxiga '10mg'$  daily             5 WEEKS OF SAMPLES GIVEN              RECHECK A1C IN 2 MONTHS              -Extensively discussed pathophysiology of diabetes, recommended lifestyle interventions, dietary effects  on blood sugar control   -Counseled on s/sx of and management of hypoglycemia   -Next A1C anticipated 3-6 MONTHS     Written patient instructions provided.  Total time in face to face counseling 30 minutes.    Follow up [facility] Visit ON 09/2022     Regina Eck, PharmD, [place] Clinical Pharmacist, [place]  II Phone [phone]

## 2022-08-05 NOTE — Telephone Encounter (Signed)
Patient requesting viagra refill --has been on in the past

## 2022-08-06 ENCOUNTER — Other Ambulatory Visit: Payer: Self-pay | Admitting: Family Medicine

## 2022-08-06 MED ORDER — SILDENAFIL CITRATE 20 MG PO TABS: 20.0000 mg | ORAL_TABLET | ORAL | 1 refills | Status: DC | PRN

## 2022-08-06 NOTE — Progress Notes (Signed)
Sent prescription for Viagra to his pharmacy

## 2022-08-06 NOTE — Telephone Encounter (Signed)
Looks like referral was placed in September, I do not know where it is but maybe we should track it down

## 2022-08-06 NOTE — Progress Notes (Signed)
Pt aware rx sent to pharmacy.

## 2022-08-11 LAB — HM DIABETES EYE EXAM

## 2022-08-13 ENCOUNTER — Other Ambulatory Visit: Payer: Self-pay | Admitting: Family Medicine

## 2022-08-13 DIAGNOSIS — E119 Type 2 diabetes mellitus without complications: Secondary | ICD-10-CM

## 2022-08-13 NOTE — Telephone Encounter (Signed)
Informed pt that there is not a generic for [place]. He states that [facility] prescribed med for him and has also given him samples x2. Pt states that the medication is $500/m.  Almyra Free is there anything you can help with?

## 2022-08-13 NOTE — Telephone Encounter (Signed)
Patient calling because his insurance will not cover dapagliflozin propanediol (FARXIGA) 10 MG TABS tablet. He stated that they will cover the generic and wants to know if it can be sent to [facility]

## 2022-08-14 ENCOUNTER — Encounter: Payer: Self-pay | Admitting: Pharmacist

## 2022-08-14 NOTE — Telephone Encounter (Signed)
Per Almyra Free- Pt was advised to continue samples of Farxiga and continue metformin. Pt has a high DED insurance plan and wont cover the [place]. Pt is aware and voiced understanding.

## 2022-09-17 ENCOUNTER — Encounter: Payer: Self-pay | Admitting: Family Medicine

## 2022-09-17 ENCOUNTER — Ambulatory Visit (INDEPENDENT_AMBULATORY_CARE_PROVIDER_SITE_OTHER): Payer: BC Managed Care – PPO | Admitting: Family Medicine

## 2022-09-17 VITALS — BP 122/67 | HR 76 | Temp 98.0°F | Ht 71.0 in | Wt 196.0 lb

## 2022-09-17 DIAGNOSIS — E1169 Type 2 diabetes mellitus with other specified complication: Secondary | ICD-10-CM

## 2022-09-17 DIAGNOSIS — E119 Type 2 diabetes mellitus without complications: Secondary | ICD-10-CM | POA: Diagnosis not present

## 2022-09-17 DIAGNOSIS — E782 Mixed hyperlipidemia: Secondary | ICD-10-CM

## 2022-09-17 DIAGNOSIS — Z7984 Long term (current) use of oral hypoglycemic drugs: Secondary | ICD-10-CM

## 2022-09-17 LAB — BAYER DCA HB A1C WAIVED: HB A1C (BAYER DCA - WAIVED): 6.9 % — ABNORMAL HIGH (ref 4.8–5.6)

## 2022-09-17 NOTE — Progress Notes (Signed)
BP 122/67   Pulse 76   Temp 98 F (36.7 C)   Ht _0  (1.803 m)   Wt 196 lb (88.9 kg)   SpO2 96%   BMI 27.34 kg/m    Subjective:   Patient ID: Keith Zimmerman, male    DOB: 1961/08/28, 62 y.o.   MRN: [id number]  HPI: Keith Zimmerman is a 62 y.o. male presenting on 09/17/2022 for Medical Management of Chronic Issues and Diabetes (New onset)   HPI Type 2 diabetes mellitus Patient comes in today for recheck of his diabetes. Patient has been currently taking metformin, stopped the [place] because of expense and because blood sugar was doing better.. Patient is not currently on an ACE inhibitor/ARB. Patient has not seen an ophthalmologist this year. Patient denies any issues with their feet. The symptom started onset as an adult hyperlipidemia ARE RELATED TO DM   Hyperlipidemia Patient is coming in for recheck of his hyperlipidemia. The patient is currently taking atorvastatin. They deny any issues with myalgias or history of liver damage from it. They deny any focal numbness or weakness or chest pain.   Relevant past medical, surgical, family and social history reviewed and updated as indicated. Interim medical history since our last visit reviewed. Allergies and medications reviewed and updated.  Review of Systems  Constitutional:  Negative for chills and fever.  Eyes:  Negative for visual disturbance.  Respiratory:  Negative for shortness of breath and wheezing.   Cardiovascular:  Negative for chest pain and leg swelling.  Musculoskeletal:  Negative for back pain and gait problem.  Skin:  Negative for rash.  Neurological:  Negative for dizziness, weakness and light-headedness.  All other systems reviewed and are negative.   Per HPI unless specifically indicated above   Allergies as of 09/17/2022       Reactions   Rhododendron Other (See Comments)   "blistering"        Medication List        Accurate as of September 17, 2022  3:05 PM. If you have any questions, ask  your nurse or doctor.          STOP taking these medications    dapagliflozin propanediol 10 MG Tabs tablet Commonly known as: [place] Stopped by: Fransisca Kaufmann Kloee Ballew, MD       TAKE these medications    atorvastatin 10 MG tablet Commonly known as: LIPITOR Take 1 tablet (10 mg total) by mouth daily.   Contour Next One Devi Use to check blood sugars as directed dx E11.9   metFORMIN 500 MG tablet Commonly known as: GLUCOPHAGE Take 1 tablet (500 mg total) by mouth 2 (two) times daily with a meal.   sildenafil 20 MG tablet Commonly known as: REVATIO Take 1-5 tablets (20-100 mg total) by mouth as needed.         Objective:   BP 122/67   Pulse 76   Temp 98 F (36.7 C)   Ht _1  (1.803 m)   Wt 196 lb (88.9 kg)   SpO2 96%   BMI 27.34 kg/m   Wt Readings from Last 3 Encounters:  09/17/22 196 lb (88.9 kg)  08/05/22 196 lb (88.9 kg)  06/13/22 214 lb (97.1 kg)    Physical Exam Vitals and nursing note reviewed.  Constitutional:      General: He is not in acute distress.    Appearance: He is well-developed. He is not diaphoretic.  Eyes:     General: No scleral icterus.  Conjunctiva/sclera: Conjunctivae normal.  Neck:     Thyroid: No thyromegaly.  Cardiovascular:     Rate and Rhythm: Normal rate and regular rhythm.     Heart sounds: Normal heart sounds. No murmur heard. Pulmonary:     Effort: Pulmonary effort is normal. No respiratory distress.     Breath sounds: Normal breath sounds. No wheezing.  Musculoskeletal:        General: No swelling. Normal range of motion.     Cervical back: Neck supple.  Lymphadenopathy:     Cervical: No cervical adenopathy.  Skin:    General: Skin is warm and dry.     Findings: No rash.  Neurological:     Mental Status: He is alert and oriented to person, place, and time.     Coordination: Coordination normal.  Psychiatric:        Behavior: Behavior normal.       Assessment & Plan:   Problem List Items Addressed  This Visit       Endocrine   Diabetes mellitus ([place]) - Primary   Relevant Orders   CBC with Differential/Platelet   CMP14+EGFR   Lipid panel   Bayer DCA Hb A1c Waived   Microalbumin / creatinine urine ratio     Other   Mixed dyslipidemia   Relevant Orders   CBC with Differential/Platelet   CMP14+EGFR   Lipid panel   Bayer DCA Hb A1c Waived    A1c 6.9 which is about the same as last time but he did stop the [place] so he is doing better overall.  Continue with current diet and metformin and we will see him back in 3 months. Follow up plan: Return in about 3 months (around 12/17/2022), or if symptoms worsen or fail to improve, for Diabetes recheck.  Counseling provided for all of the vaccine components Orders Placed This Encounter  Procedures   CBC with Differential/Platelet   CMP14+EGFR   Lipid panel   Bayer DCA Hb A1c Waived   Microalbumin / creatinine urine ratio    Caryl Pina, MD [facility] 09/17/2022, 3:05 PM

## 2022-09-18 LAB — CBC WITH DIFFERENTIAL/PLATELET
Basophils Absolute: 0.1 10*3/uL (ref 0.0–0.2)
Basos: 0 %
EOS (ABSOLUTE): 0.1 10*3/uL (ref 0.0–0.4)
Eos: 1 %
Hematocrit: 47.1 % (ref 37.5–51.0)
Hemoglobin: 15.6 g/dL (ref 13.0–17.7)
Immature Grans (Abs): 0 10*3/uL (ref 0.0–0.1)
Immature Granulocytes: 0 %
Lymphocytes Absolute: 1.5 10*3/uL (ref 0.7–3.1)
Lymphs: 13 %
MCH: 28.9 pg (ref 26.6–33.0)
MCHC: 33.1 g/dL (ref 31.5–35.7)
MCV: 87 fL (ref 79–97)
Monocytes Absolute: 0.8 10*3/uL (ref 0.1–0.9)
Monocytes: 7 %
Neutrophils Absolute: 9 10*3/uL — ABNORMAL HIGH (ref 1.4–7.0)
Neutrophils: 79 %
Platelets: 203 10*3/uL (ref 150–450)
RBC: 5.39 x10E6/uL (ref 4.14–5.80)
RDW: 12.9 % (ref 11.6–15.4)
WBC: 11.6 10*3/uL — ABNORMAL HIGH (ref 3.4–10.8)

## 2022-09-18 LAB — CMP14+EGFR
ALT: 27 IU/L (ref 0–44)
AST: 18 IU/L (ref 0–40)
Albumin/Globulin Ratio: 2.2 (ref 1.2–2.2)
Albumin: 5 g/dL — ABNORMAL HIGH (ref 3.8–4.9)
Alkaline Phosphatase: 87 IU/L (ref 44–121)
BUN/Creatinine Ratio: 27 — ABNORMAL HIGH (ref 10–24)
BUN: 25 mg/dL (ref 8–27)
Bilirubin Total: 0.9 mg/dL (ref 0.0–1.2)
CO2: 22 mmol/L (ref 20–29)
Calcium: 9.8 mg/dL (ref 8.6–10.2)
Chloride: 106 mmol/L (ref 96–106)
Creatinine, Ser: 0.93 mg/dL (ref 0.76–1.27)
Globulin, Total: 2.3 g/dL (ref 1.5–4.5)
Glucose: 100 mg/dL — ABNORMAL HIGH (ref 70–99)
Potassium: 4.5 mmol/L (ref 3.5–5.2)
Sodium: 144 mmol/L (ref 134–144)
Total Protein: 7.3 g/dL (ref 6.0–8.5)
eGFR: 94 mL/min/{1.73_m2} (ref >59)

## 2022-09-18 LAB — LIPID PANEL
Chol/HDL Ratio: 5.5 ratio — ABNORMAL HIGH (ref 0.0–5.0)
Cholesterol, Total: 238 mg/dL — ABNORMAL HIGH (ref 100–199)
HDL: 43 mg/dL (ref >39)
LDL Chol Calc (NIH): 157 mg/dL — ABNORMAL HIGH (ref 0–99)
Triglycerides: 205 mg/dL — ABNORMAL HIGH (ref 0–149)
VLDL Cholesterol Cal: 38 mg/dL (ref 5–40)

## 2022-09-25 ENCOUNTER — Other Ambulatory Visit: Payer: Self-pay | Admitting: Family Medicine

## 2022-11-13 ENCOUNTER — Other Ambulatory Visit: Payer: Self-pay | Admitting: Family Medicine

## 2022-12-24 ENCOUNTER — Encounter: Payer: Self-pay | Admitting: Family Medicine

## 2022-12-24 ENCOUNTER — Ambulatory Visit (INDEPENDENT_AMBULATORY_CARE_PROVIDER_SITE_OTHER): Payer: BC Managed Care – PPO | Admitting: Family Medicine

## 2022-12-24 VITALS — BP 138/83 | HR 61 | Ht 71.0 in | Wt 200.0 lb

## 2022-12-24 DIAGNOSIS — E1169 Type 2 diabetes mellitus with other specified complication: Secondary | ICD-10-CM | POA: Diagnosis not present

## 2022-12-24 DIAGNOSIS — E782 Mixed hyperlipidemia: Secondary | ICD-10-CM | POA: Diagnosis not present

## 2022-12-24 LAB — BAYER DCA HB A1C WAIVED: HB A1C (BAYER DCA - WAIVED): 5.8 % — ABNORMAL HIGH (ref 4.8–5.6)

## 2022-12-24 MED ORDER — METFORMIN HCL 500 MG PO TABS: 500.0000 mg | ORAL_TABLET | Freq: Two times a day (BID) | ORAL | 3 refills | Status: DC

## 2022-12-24 MED ORDER — ATORVASTATIN CALCIUM 10 MG PO TABS: 10.0000 mg | ORAL_TABLET | Freq: Every day | ORAL | 3 refills | Status: DC

## 2022-12-24 MED ORDER — TADALAFIL 10 MG PO TABS: 10.0000 mg | ORAL_TABLET | ORAL | 2 refills | Status: DC | PRN

## 2022-12-24 NOTE — Addendum Note (Signed)
Addended by: Arville Care on: 12/24/2022 10:40 AM   Modules accepted: Orders

## 2022-12-24 NOTE — Progress Notes (Signed)
BP 138/83   Pulse 61   Ht 5\' 11"  (1.803 m)   Wt 200 lb (90.7 kg)   SpO2 99%   BMI 27.89 kg/m    Subjective:   Patient ID: Junuis Vidovich, male    DOB: 30-Sep-1960, 62 y.o.   MRN: [id number]  HPI: Ajax Stash is a 62 y.o. male presenting on 12/24/2022 for Medical Management of Chronic Issues, Hyperlipidemia, and Diabetes   HPI Type 2 diabetes mellitus Patient comes in today for recheck of his diabetes. Patient has been currently taking metformin, he does admit that he misses some days. Patient is not currently on an ACE inhibitor/ARB. Patient has not seen an ophthalmologist this year. Patient denies any new issues with their feet. The symptom started onset as an adult hyperlipidemia ARE RELATED TO DM   Hyperlipidemia Patient is coming in for recheck of his hyperlipidemia. The patient is currently taking atorvastatin. They deny any issues with myalgias or history of liver damage from it. They deny any focal numbness or weakness or chest pain.   Relevant past medical, surgical, family and social history reviewed and updated as indicated. Interim medical history since our last visit reviewed. Allergies and medications reviewed and updated.  Review of Systems  Constitutional:  Negative for chills and fever.  Eyes:  Negative for visual disturbance.  Respiratory:  Negative for shortness of breath and wheezing.   Cardiovascular:  Negative for chest pain and leg swelling.  Musculoskeletal:  Negative for back pain and gait problem.  Skin:  Negative for rash.  Neurological:  Negative for dizziness and light-headedness.  All other systems reviewed and are negative.   Per HPI unless specifically indicated above   Allergies as of 12/24/2022       Reactions   Rhododendron Other (See Comments)   "blistering"        Medication List        Accurate as of December 24, 2022 10:31 AM. If you have any questions, ask your nurse or doctor.          atorvastatin 10 MG  tablet Commonly known as: LIPITOR Take 1 tablet (10 mg total) by mouth daily.   Contour Next One Devi Use to check blood sugars as directed dx E11.9   metFORMIN 500 MG tablet Commonly known as: GLUCOPHAGE Take 1 tablet (500 mg total) by mouth 2 (two) times daily with a meal.   sildenafil 20 MG tablet Commonly known as: REVATIO TAKE 1-5 TABLETS (20-100 MG TOTAL) BY MOUTH AS NEEDED.         Objective:   BP 138/83   Pulse 61   Ht 5\' 11"  (1.803 m)   Wt 200 lb (90.7 kg)   SpO2 99%   BMI 27.89 kg/m   Wt Readings from Last 3 Encounters:  12/24/22 200 lb (90.7 kg)  09/17/22 196 lb (88.9 kg)  08/05/22 196 lb (88.9 kg)    Physical Exam Vitals and nursing note reviewed.  Constitutional:      General: He is not in acute distress.    Appearance: He is well-developed. He is not diaphoretic.  Eyes:     General: No scleral icterus.    Conjunctiva/sclera: Conjunctivae normal.  Neck:     Thyroid: No thyromegaly.  Cardiovascular:     Rate and Rhythm: Normal rate and regular rhythm.     Heart sounds: Normal heart sounds. No murmur heard. Pulmonary:     Effort: Pulmonary effort is normal. No respiratory distress.  Breath sounds: Normal breath sounds. No wheezing.  Musculoskeletal:        General: No swelling. Normal range of motion.     Cervical back: Neck supple.  Lymphadenopathy:     Cervical: No cervical adenopathy.  Skin:    General: Skin is warm and dry.     Findings: No rash.  Neurological:     Mental Status: He is alert and oriented to person, place, and time.     Coordination: Coordination normal.  Psychiatric:        Behavior: Behavior normal.       Assessment & Plan:   Problem List Items Addressed This Visit       Endocrine   Diabetes mellitus - Primary   Relevant Medications   atorvastatin (LIPITOR) 10 MG tablet   metFORMIN (GLUCOPHAGE) 500 MG tablet   Other Relevant Orders   BMP8+EGFR   Lipid panel   Bayer DCA Hb A1c Waived     Other    Mixed dyslipidemia   Relevant Medications   atorvastatin (LIPITOR) 10 MG tablet   Other Relevant Orders   BMP8+EGFR   Lipid panel   Bayer DCA Hb A1c Waived    A1c looks good at 5.8. Follow up plan: Return in about 3 months (around 03/25/2023), or if symptoms worsen or fail to improve, for Diabetes and hyperlipidemia.  Counseling provided for all of the vaccine components Orders Placed This Encounter  Procedures   BMP8+EGFR   Lipid panel   Bayer DCA Hb A1c Waived    Arville Care, MD [facility] 12/24/2022, 10:31 AM

## 2022-12-25 LAB — BMP8+EGFR
BUN/Creatinine Ratio: 21 (ref 10–24)
BUN: 21 mg/dL (ref 8–27)
CO2: 20 mmol/L (ref 20–29)
Calcium: 9.1 mg/dL (ref 8.6–10.2)
Chloride: 105 mmol/L (ref 96–106)
Creatinine, Ser: 1.02 mg/dL (ref 0.76–1.27)
Glucose: 113 mg/dL — ABNORMAL HIGH (ref 70–99)
Potassium: 4.3 mmol/L (ref 3.5–5.2)
Sodium: 144 mmol/L (ref 134–144)
eGFR: 84 mL/min/{1.73_m2} (ref >59)

## 2022-12-25 LAB — LIPID PANEL
Chol/HDL Ratio: 4 ratio (ref 0.0–5.0)
Cholesterol, Total: 157 mg/dL (ref 100–199)
HDL: 39 mg/dL — ABNORMAL LOW (ref >39)
LDL Chol Calc (NIH): 85 mg/dL (ref 0–99)
Triglycerides: 194 mg/dL — ABNORMAL HIGH (ref 0–149)
VLDL Cholesterol Cal: 33 mg/dL (ref 5–40)

## 2022-12-25 LAB — MICROALBUMIN / CREATININE URINE RATIO
Creatinine, Urine: 122.4 mg/dL
Microalb/Creat Ratio: 43 mg/g creat — ABNORMAL HIGH (ref 0–29)
Microalbumin, Urine: 52.9 ug/mL

## 2022-12-29 ENCOUNTER — Other Ambulatory Visit: Payer: Self-pay

## 2022-12-29 MED ORDER — LISINOPRIL 5 MG PO TABS: 5.0000 mg | ORAL_TABLET | Freq: Every day | ORAL | 3 refills | Status: DC

## 2023-01-01 ENCOUNTER — Other Ambulatory Visit: Payer: Self-pay | Admitting: Family Medicine

## 2023-01-01 MED ORDER — LOSARTAN POTASSIUM 25 MG PO TABS: 12.5000 mg | ORAL_TABLET | Freq: Every day | ORAL | 1 refills | Status: DC

## 2023-01-01 NOTE — Progress Notes (Signed)
Left message making pt aware and to call back if needed. 

## 2023-01-01 NOTE — Progress Notes (Signed)
Since patient says he cannot take lisinopril, sent losartan, just wanted to take a half a tablet daily, mainly this is for kidney protection.

## 2023-02-11 ENCOUNTER — Ambulatory Visit: Payer: BC Managed Care – PPO | Admitting: Family Medicine

## 2023-03-02 ENCOUNTER — Other Ambulatory Visit: Payer: Self-pay | Admitting: Family Medicine

## 2023-03-30 ENCOUNTER — Ambulatory Visit (INDEPENDENT_AMBULATORY_CARE_PROVIDER_SITE_OTHER): Payer: BC Managed Care – PPO | Admitting: Family Medicine

## 2023-03-30 ENCOUNTER — Encounter: Payer: Self-pay | Admitting: Family Medicine

## 2023-03-30 VITALS — BP 134/70 | HR 72 | Ht 71.0 in | Wt 205.0 lb

## 2023-03-30 DIAGNOSIS — I10 Essential (primary) hypertension: Secondary | ICD-10-CM

## 2023-03-30 DIAGNOSIS — I152 Hypertension secondary to endocrine disorders: Secondary | ICD-10-CM | POA: Diagnosis not present

## 2023-03-30 DIAGNOSIS — E1159 Type 2 diabetes mellitus with other circulatory complications: Secondary | ICD-10-CM

## 2023-03-30 DIAGNOSIS — E782 Mixed hyperlipidemia: Secondary | ICD-10-CM | POA: Diagnosis not present

## 2023-03-30 DIAGNOSIS — E1169 Type 2 diabetes mellitus with other specified complication: Secondary | ICD-10-CM | POA: Diagnosis not present

## 2023-03-30 LAB — BAYER DCA HB A1C WAIVED: HB A1C (BAYER DCA - WAIVED): 6.5 % — ABNORMAL HIGH (ref 4.8–5.6)

## 2023-03-30 MED ORDER — LOSARTAN POTASSIUM 25 MG PO TABS: 12.5000 mg | ORAL_TABLET | Freq: Every day | ORAL | 1 refills | Status: DC

## 2023-03-30 MED ORDER — LOSARTAN POTASSIUM 25 MG PO TABS: 12.5000 mg | ORAL_TABLET | Freq: Every day | ORAL | 3 refills | Status: DC

## 2023-03-30 NOTE — Progress Notes (Signed)
BP 134/70   Pulse 72   Ht 5\' 11"  (1.803 m)   Wt 205 lb (93 kg)   SpO2 94%   BMI 28.59 kg/m    Subjective:   Patient ID: Alessandro Griep, male    DOB: Oct 12, 1960, 62 y.o.   MRN: [id number]  HPI: Demetria Lightsey is a 61 y.o. male presenting on 03/30/2023 for Medical Management of Chronic Issues, Diabetes, Hypertension, and Hyperlipidemia   HPI Type 2 diabetes mellitus Patient comes in today for recheck of his diabetes. Patient has been currently taking metformin, he did have 1 or 2 days where he had a low down into the 60s and felt bad. Patient is currently on an ACE inhibitor/ARB. Patient has not seen an ophthalmologist this year. Patient denies any new issues with their feet. The symptom started onset as an adult hypertension and hyperlipidemia ARE RELATED TO DM   Hypertension Patient is currently on losartan, and their blood pressure today is 134/70. Patient denies any lightheadedness or dizziness. Patient denies headaches, blurred vision, chest pains, shortness of breath, or weakness. Denies any side effects from medication and is content with current medication.   Hyperlipidemia Patient is coming in for recheck of his hyperlipidemia. The patient is currently taking atorvastatin. They deny any issues with myalgias or history of liver damage from it. They deny any focal numbness or weakness or chest pain.   Relevant past medical, surgical, family and social history reviewed and updated as indicated. Interim medical history since our last visit reviewed. Allergies and medications reviewed and updated.  Review of Systems  Constitutional:  Negative for chills and fever.  Eyes:  Negative for visual disturbance.  Respiratory:  Negative for shortness of breath and wheezing.   Cardiovascular:  Negative for chest pain and leg swelling.  Musculoskeletal:  Negative for back pain and gait problem.  Skin:  Negative for rash.  Neurological:  Negative for dizziness, weakness and  light-headedness.  All other systems reviewed and are negative.   Per HPI unless specifically indicated above   Allergies as of 03/30/2023       Reactions   Rhododendron Other (See Comments)   "blistering"        Medication List        Accurate as of March 30, 2023 10:18 AM. If you have any questions, ask your nurse or doctor.          STOP taking these medications    metFORMIN 500 MG tablet Commonly known as: GLUCOPHAGE Stopped by: Elige Radon Cosme Jacob       TAKE these medications    atorvastatin 10 MG tablet Commonly known as: LIPITOR Take 1 tablet (10 mg total) by mouth daily.   CINNAMON PO Take 1,000 mg by mouth in the morning, at noon, in the evening, and at bedtime.   Contour Next One Devi Use to check blood sugars as directed dx E11.9   losartan 25 MG tablet Commonly known as: COZAAR Take 0.5 tablets (12.5 mg total) by mouth daily.   tadalafil 10 MG tablet Commonly known as: CIALIS TAKE 1 TABLET (10 MG TOTAL) BY MOUTH EVERY OTHER DAY AS NEEDED FOR ERECTILE DYSFUNCTION.         Objective:   BP 134/70   Pulse 72   Ht 5\' 11"  (1.803 m)   Wt 205 lb (93 kg)   SpO2 94%   BMI 28.59 kg/m   Wt Readings from Last 3 Encounters:  03/30/23 205 lb (93 kg)  12/24/22 200  lb (90.7 kg)  09/17/22 196 lb (88.9 kg)    Physical Exam Vitals and nursing note reviewed.  Constitutional:      General: He is not in acute distress.    Appearance: He is well-developed. He is not diaphoretic.  Eyes:     General: No scleral icterus.    Conjunctiva/sclera: Conjunctivae normal.  Neck:     Thyroid: No thyromegaly.  Cardiovascular:     Rate and Rhythm: Normal rate and regular rhythm.     Heart sounds: Normal heart sounds. No murmur heard. Pulmonary:     Effort: Pulmonary effort is normal. No respiratory distress.     Breath sounds: Normal breath sounds. No wheezing.  Musculoskeletal:        General: No swelling. Normal range of motion.     Cervical back: Neck  supple.  Lymphadenopathy:     Cervical: No cervical adenopathy.  Skin:    General: Skin is warm and dry.     Findings: No rash.  Neurological:     Mental Status: He is alert and oriented to person, place, and time.     Coordination: Coordination normal.  Psychiatric:        Behavior: Behavior normal.       Assessment & Plan:   Problem List Items Addressed This Visit       Cardiovascular and Mediastinum   Hypertension, essential   Relevant Medications   losartan (COZAAR) 25 MG tablet     Endocrine   Diabetes mellitus ([facility]) - Primary   Relevant Medications   losartan (COZAAR) 25 MG tablet   Other Relevant Orders   Bayer DCA Hb A1c Waived     Other   Mixed dyslipidemia    A1c was 6.5, last time was 5.8.  He is really been focus on diet and we will stop the metformin because he feels like he compensated by eating a little more sugary stuff because he was getting some low blood sugars.  No other change medicine. Follow up plan: Return in about 3 months (around 06/30/2023), or if symptoms worsen or fail to improve, for Diabetes hypertension and cholesterol.  Counseling provided for all of the vaccine components Orders Placed This Encounter  Procedures   Bayer DCA Hb A1c Waived    Arville Care, MD [facility] 03/30/2023, 10:18 AM

## 2023-05-11 ENCOUNTER — Other Ambulatory Visit: Payer: Self-pay | Admitting: Family Medicine

## 2023-07-01 ENCOUNTER — Ambulatory Visit: Payer: BC Managed Care – PPO | Admitting: Family Medicine

## 2023-07-01 ENCOUNTER — Encounter: Payer: Self-pay | Admitting: Family Medicine

## 2023-07-01 VITALS — BP 133/74 | HR 59 | Ht 71.0 in | Wt 213.0 lb

## 2023-07-01 DIAGNOSIS — E1159 Type 2 diabetes mellitus with other circulatory complications: Secondary | ICD-10-CM | POA: Diagnosis not present

## 2023-07-01 DIAGNOSIS — E1169 Type 2 diabetes mellitus with other specified complication: Secondary | ICD-10-CM

## 2023-07-01 DIAGNOSIS — I10 Essential (primary) hypertension: Secondary | ICD-10-CM | POA: Diagnosis not present

## 2023-07-01 DIAGNOSIS — E782 Mixed hyperlipidemia: Secondary | ICD-10-CM

## 2023-07-01 LAB — LIPID PANEL
Chol/HDL Ratio: 5.3 {ratio} — ABNORMAL HIGH (ref 0.0–5.0)
Cholesterol, Total: 192 mg/dL (ref 100–199)
HDL: 36 mg/dL — ABNORMAL LOW (ref >39)
LDL Chol Calc (NIH): 102 mg/dL — ABNORMAL HIGH (ref 0–99)
Triglycerides: 319 mg/dL — ABNORMAL HIGH (ref 0–149)
VLDL Cholesterol Cal: 54 mg/dL — ABNORMAL HIGH (ref 5–40)

## 2023-07-01 LAB — CMP14+EGFR
ALT: 44 [IU]/L (ref 0–44)
AST: 19 [IU]/L (ref 0–40)
Albumin: 4.7 g/dL (ref 3.9–4.9)
Alkaline Phosphatase: 98 [IU]/L (ref 44–121)
BUN/Creatinine Ratio: 25 — ABNORMAL HIGH (ref 10–24)
BUN: 24 mg/dL (ref 8–27)
Bilirubin Total: 0.8 mg/dL (ref 0.0–1.2)
CO2: 23 mmol/L (ref 20–29)
Calcium: 9.6 mg/dL (ref 8.6–10.2)
Chloride: 105 mmol/L (ref 96–106)
Creatinine, Ser: 0.97 mg/dL (ref 0.76–1.27)
Globulin, Total: 2.4 g/dL (ref 1.5–4.5)
Glucose: 193 mg/dL — ABNORMAL HIGH (ref 70–99)
Potassium: 4.4 mmol/L (ref 3.5–5.2)
Sodium: 142 mmol/L (ref 134–144)
Total Protein: 7.1 g/dL (ref 6.0–8.5)
eGFR: 89 mL/min/{1.73_m2} (ref >59)

## 2023-07-01 LAB — CBC WITH DIFFERENTIAL/PLATELET
Basophils Absolute: 0 10*3/uL (ref 0.0–0.2)
Basos: 1 %
EOS (ABSOLUTE): 0.2 10*3/uL (ref 0.0–0.4)
Eos: 3 %
Hematocrit: 45.8 % (ref 37.5–51.0)
Hemoglobin: 15.1 g/dL (ref 13.0–17.7)
Immature Grans (Abs): 0 10*3/uL (ref 0.0–0.1)
Immature Granulocytes: 1 %
Lymphocytes Absolute: 1.5 10*3/uL (ref 0.7–3.1)
Lymphs: 24 %
MCH: 30.3 pg (ref 26.6–33.0)
MCHC: 33 g/dL (ref 31.5–35.7)
MCV: 92 fL (ref 79–97)
Monocytes Absolute: 0.6 10*3/uL (ref 0.1–0.9)
Monocytes: 9 %
Neutrophils Absolute: 4 10*3/uL (ref 1.4–7.0)
Neutrophils: 62 %
Platelets: 159 10*3/uL (ref 150–450)
RBC: 4.99 x10E6/uL (ref 4.14–5.80)
RDW: 12.5 % (ref 11.6–15.4)
WBC: 6.3 10*3/uL (ref 3.4–10.8)

## 2023-07-01 LAB — BAYER DCA HB A1C WAIVED: HB A1C (BAYER DCA - WAIVED): 6.3 % — ABNORMAL HIGH (ref 4.8–5.6)

## 2023-07-01 NOTE — Patient Instructions (Signed)
Please call [facility] to schedule your Colonoscopy with Dr. Myrtie Neither. [phone].

## 2023-07-01 NOTE — Progress Notes (Signed)
BP 133/74   Pulse (!) 59   Ht 5\' 11"  (1.803 m)   Wt 213 lb (96.6 kg)   SpO2 97%   BMI 29.71 kg/m    Subjective:   Patient ID: Keith Zimmerman, male    DOB: March 22, 1961, 62 y.o.   MRN: [id number]  HPI: Keith Zimmerman is a 61 y.o. male presenting on 07/01/2023 for No chief complaint on file.   HPI Type 2 diabetes mellitus Patient comes in today for recheck of his diabetes. Patient has been currently taking no medicine, doing diet control for. Patient is currently on an ACE inhibitor/ARB. Patient has not seen an ophthalmologist this year. Patient denies any new issues with their feet. The symptom started onset as an adult hypertension and hyperlipidemia ARE RELATED TO DM   Hyperlipidemia Patient is coming in for recheck of his hyperlipidemia. The patient is currently taking atorvastatin. They deny any issues with myalgias or history of liver damage from it. They deny any focal numbness or weakness or chest pain.   Hypertension Patient is currently on losartan, and their blood pressure today is 133/74. Patient denies any lightheadedness or dizziness. Patient denies headaches, blurred vision, chest pains, shortness of breath, or weakness. Denies any side effects from medication and is content with current medication.   Relevant past medical, surgical, family and social history reviewed and updated as indicated. Interim medical history since our last visit reviewed. Allergies and medications reviewed and updated.  Review of Systems  Constitutional:  Negative for chills and fever.  Eyes:  Negative for visual disturbance.  Respiratory:  Negative for shortness of breath and wheezing.   Cardiovascular:  Negative for chest pain and leg swelling.  Musculoskeletal:  Negative for back pain and gait problem.  Skin:  Negative for rash.  Neurological:  Negative for dizziness and numbness.  All other systems reviewed and are negative.   Per HPI unless specifically indicated  above   Allergies as of 07/01/2023       Reactions   Rhododendron Other (See Comments)   "blistering"        Medication List        Accurate as of July 01, 2023 10:12 AM. If you have any questions, ask your nurse or doctor.          atorvastatin 10 MG tablet Commonly known as: LIPITOR Take 1 tablet (10 mg total) by mouth daily.   CINNAMON PO Take 1,000 mg by mouth in the morning, at noon, in the evening, and at bedtime.   Contour Next One Devi Use to check blood sugars as directed dx E11.9   losartan 25 MG tablet Commonly known as: COZAAR Take 0.5 tablets (12.5 mg total) by mouth daily.   tadalafil 10 MG tablet Commonly known as: CIALIS TAKE 1 TABLET (10 MG TOTAL) BY MOUTH EVERY OTHER DAY AS NEEDED FOR ERECTILE DYSFUNCTION.         Objective:   BP 133/74   Pulse (!) 59   Ht 5\' 11"  (1.803 m)   Wt 213 lb (96.6 kg)   SpO2 97%   BMI 29.71 kg/m   Wt Readings from Last 3 Encounters:  07/01/23 213 lb (96.6 kg)  03/30/23 205 lb (93 kg)  12/24/22 200 lb (90.7 kg)    Physical Exam Vitals and nursing note reviewed.  Constitutional:      General: He is not in acute distress.    Appearance: He is well-developed. He is not diaphoretic.  Eyes:  General: No scleral icterus.    Conjunctiva/sclera: Conjunctivae normal.  Neck:     Thyroid: No thyromegaly.  Cardiovascular:     Rate and Rhythm: Normal rate and regular rhythm.     Heart sounds: Normal heart sounds. No murmur heard. Pulmonary:     Effort: Pulmonary effort is normal. No respiratory distress.     Breath sounds: Normal breath sounds. No wheezing.  Musculoskeletal:        General: No swelling. Normal range of motion.     Cervical back: Neck supple.  Lymphadenopathy:     Cervical: No cervical adenopathy.  Skin:    General: Skin is warm and dry.     Findings: No rash.  Neurological:     Mental Status: He is alert and oriented to person, place, and time.     Coordination: Coordination  normal.  Psychiatric:        Behavior: Behavior normal.       Assessment & Plan:   Problem List Items Addressed This Visit       Cardiovascular and Mediastinum   Hypertension, essential   Relevant Orders   CBC with Differential/Platelet   CMP14+EGFR   Lipid panel   Bayer DCA Hb A1c Waived     Endocrine   Diabetes mellitus (HCC) - Primary   Relevant Orders   CBC with Differential/Platelet   CMP14+EGFR   Lipid panel   Bayer DCA Hb A1c Waived     Other   Mixed dyslipidemia   Relevant Orders   CBC with Differential/Platelet   CMP14+EGFR   Lipid panel   Bayer DCA Hb A1c Waived  A1c 6.4, showing improvement, continue with diet  He is due for another colonoscopy because they found 5 polyps last time anyone come back sooner.  Follow up plan: Return in about 3 months (around 10/01/2023), or if symptoms worsen or fail to improve, for Diabetes recheck.  Counseling provided for all of the vaccine components Orders Placed This Encounter  Procedures   CBC with Differential/Platelet   CMP14+EGFR   Lipid panel   Bayer DCA Hb A1c Waived    Arville Care, MD [place] [facility] 07/01/2023, 10:12 AM

## 2023-07-06 ENCOUNTER — Other Ambulatory Visit: Payer: Self-pay

## 2023-07-06 MED ORDER — ATORVASTATIN CALCIUM 20 MG PO TABS: 20.0000 mg | ORAL_TABLET | Freq: Every day | ORAL | 1 refills | Status: DC

## 2023-08-03 ENCOUNTER — Encounter: Payer: Self-pay | Admitting: Gastroenterology

## 2023-08-05 ENCOUNTER — Ambulatory Visit: Payer: BC Managed Care – PPO

## 2023-08-05 DIAGNOSIS — E1169 Type 2 diabetes mellitus with other specified complication: Secondary | ICD-10-CM | POA: Diagnosis not present

## 2023-08-05 LAB — HM DIABETES EYE EXAM

## 2023-08-05 NOTE — Progress Notes (Signed)
Keith Zimmerman arrived 08/05/2023 and has given verbal consent to obtain images and complete their overdue diabetic retinal screening.  The images have been sent to an ophthalmologist or optometrist for review and interpretation.  Results will be sent back to Dettinger, Elige Radon, MD for review.  Patient has been informed they will be contacted when we receive the results via telephone or MyChart

## 2023-10-05 ENCOUNTER — Ambulatory Visit (INDEPENDENT_AMBULATORY_CARE_PROVIDER_SITE_OTHER): Payer: BC Managed Care – PPO | Admitting: Family Medicine

## 2023-10-05 VITALS — BP 141/76 | HR 59 | Ht 71.0 in | Wt 218.0 lb

## 2023-10-05 DIAGNOSIS — E782 Mixed hyperlipidemia: Secondary | ICD-10-CM

## 2023-10-05 DIAGNOSIS — I10 Essential (primary) hypertension: Secondary | ICD-10-CM | POA: Diagnosis not present

## 2023-10-05 DIAGNOSIS — E1169 Type 2 diabetes mellitus with other specified complication: Secondary | ICD-10-CM | POA: Diagnosis not present

## 2023-10-05 LAB — BAYER DCA HB A1C WAIVED: HB A1C (BAYER DCA - WAIVED): 6.6 % — ABNORMAL HIGH (ref 4.8–5.6)

## 2023-10-05 NOTE — Progress Notes (Signed)
BP (!) 141/76   Pulse (!) 59   Ht 5\' 11"  (1.803 m)   Wt 218 lb (98.9 kg)   SpO2 97%   BMI 30.40 kg/m    Subjective:   Patient ID: Keith Zimmerman, male    DOB: 1960/10/07, 63 y.o.   MRN: [id number]  HPI: Rhonda Ledsome is a 63 y.o. male presenting on 10/05/2023 for Medical Management of Chronic Issues, Diabetes, and Hyperlipidemia   HPI Type 2 diabetes mellitus Patient comes in today for recheck of his diabetes. Patient has been currently taking diet control. Patient is currently on an ACE inhibitor/ARB. Patient has seen an ophthalmologist this year. Patient denies any new issues with their feet. The symptom started onset as an adult hypertension and hyperlipidemia ARE RELATED TO DM   Hyperlipidemia Patient is coming in for recheck of his hyperlipidemia. The patient is currently taking atorvastatin. They deny any issues with myalgias or history of liver damage from it. They deny any focal numbness or weakness or chest pain.   Hypertension Patient is currently on losartan, and their blood pressure today is 155/77. Patient denies any lightheadedness or dizziness. Patient denies headaches, blurred vision, chest pains, shortness of breath, or weakness. Denies any side effects from medication and is content with current medication.   Relevant past medical, surgical, family and social history reviewed and updated as indicated. Interim medical history since our last visit reviewed. Allergies and medications reviewed and updated.  Review of Systems  Constitutional:  Negative for chills and fever.  Eyes:  Negative for visual disturbance.  Respiratory:  Negative for shortness of breath and wheezing.   Cardiovascular:  Negative for chest pain and leg swelling.  Musculoskeletal:  Negative for back pain and gait problem.  Skin:  Negative for rash.  Neurological:  Negative for dizziness, weakness and light-headedness.  All other systems reviewed and are negative.   Per HPI unless  specifically indicated above   Allergies as of 10/05/2023       Reactions   Rhododendron Other (See Comments)   "blistering"        Medication List        Accurate as of October 05, 2023 10:36 AM. If you have any questions, ask your nurse or doctor.          atorvastatin 20 MG tablet Commonly known as: LIPITOR Take 1 tablet (20 mg total) by mouth daily.   CINNAMON PO Take 1,000 mg by mouth in the morning, at noon, in the evening, and at bedtime.   Contour Next One Devi Use to check blood sugars as directed dx E11.9   losartan 25 MG tablet Commonly known as: COZAAR Take 0.5 tablets (12.5 mg total) by mouth daily.   tadalafil 10 MG tablet Commonly known as: CIALIS TAKE 1 TABLET (10 MG TOTAL) BY MOUTH EVERY OTHER DAY AS NEEDED FOR ERECTILE DYSFUNCTION.         Objective:   BP (!) 141/76   Pulse (!) 59   Ht 5\' 11"  (1.803 m)   Wt 218 lb (98.9 kg)   SpO2 97%   BMI 30.40 kg/m   Wt Readings from Last 3 Encounters:  10/05/23 218 lb (98.9 kg)  07/01/23 213 lb (96.6 kg)  03/30/23 205 lb (93 kg)    Physical Exam Vitals and nursing note reviewed.  Constitutional:      General: He is not in acute distress.    Appearance: He is well-developed. He is not diaphoretic.  Eyes:  General: No scleral icterus.    Conjunctiva/sclera: Conjunctivae normal.  Neck:     Thyroid: No thyromegaly.  Cardiovascular:     Rate and Rhythm: Normal rate and regular rhythm.     Heart sounds: Normal heart sounds. No murmur heard. Pulmonary:     Effort: Pulmonary effort is normal. No respiratory distress.     Breath sounds: Normal breath sounds. No wheezing.  Musculoskeletal:        General: No swelling. Normal range of motion.     Cervical back: Neck supple.  Lymphadenopathy:     Cervical: No cervical adenopathy.  Skin:    General: Skin is warm and dry.     Findings: No rash.  Neurological:     Mental Status: He is alert and oriented to person, place, and time.      Coordination: Coordination normal.  Psychiatric:        Behavior: Behavior normal.     Results for orders placed or performed in visit on 08/26/23  HM DIABETES EYE EXAM   Collection Time: 08/05/23 12:00 AM  Result Value Ref Range   HM Diabetic Eye Exam No Retinopathy No Retinopathy    Assessment & Plan:   Problem List Items Addressed This Visit       Cardiovascular and Mediastinum   Hypertension, essential     Endocrine   Diabetes mellitus (HCC) - Primary   Relevant Orders   Bayer DCA Hb A1c Waived     Other   Mixed dyslipidemia  A1c is 6.6 and looks good. Blood pressure elevated a little bit today.  He wants to focus on diet for it and we will keep a close eye on monitoring it.  Recheck 141/76 Follow up plan: Return if symptoms worsen or fail to improve, for 3 to 28-month A1c and diabetes check.  Counseling provided for all of the vaccine components Orders Placed This Encounter  Procedures   Bayer DCA Hb A1c Waived    Arville Care, MD [facility] 10/05/2023, 10:36 AM

## 2023-10-08 ENCOUNTER — Ambulatory Visit: Payer: Self-pay | Admitting: Family Medicine

## 2023-10-08 ENCOUNTER — Encounter: Payer: Self-pay | Admitting: Family Medicine

## 2023-10-08 ENCOUNTER — Ambulatory Visit: Payer: BC Managed Care – PPO | Admitting: Family Medicine

## 2023-10-08 VITALS — BP 168/84 | HR 60 | Temp 98.2°F | Ht 71.0 in | Wt 220.0 lb

## 2023-10-08 DIAGNOSIS — R599 Enlarged lymph nodes, unspecified: Secondary | ICD-10-CM

## 2023-10-08 DIAGNOSIS — H65193 Other acute nonsuppurative otitis media, bilateral: Secondary | ICD-10-CM | POA: Diagnosis not present

## 2023-10-08 DIAGNOSIS — I10 Essential (primary) hypertension: Secondary | ICD-10-CM | POA: Diagnosis not present

## 2023-10-08 MED ORDER — LOSARTAN POTASSIUM 25 MG PO TABS
25.0000 mg | ORAL_TABLET | Freq: Every day | ORAL | Status: DC
Start: 2023-10-08 — End: 2023-10-19

## 2023-10-08 NOTE — Progress Notes (Signed)
Subjective:  Patient ID: Keith Zimmerman, male    DOB: [birth year], 63 y.o.   MRN: [id number]  Patient Care Team: Dettinger, Elige Radon, MD as PCP - General (Family Medicine)   Chief Complaint:  Hypertension   HPI: Keith Zimmerman is a 63 y.o. male presenting on 10/08/2023 for Hypertension  Hypertension He was evaluated by PCP 1/20 and isntructed ot follow BP at home.  He has been checking it at home.  151/77 and 148/80 States that throat feels swollen and he can feel his heart beat in his head and throat. States that this started a few weeks ago. States that it was not severe at that time. He would notice it while going to bed. States that he "feels like he is at a high altitude"  Has BP monitor at home Yes ROS Denies anxiety, fatigue, peripheral edema, changes to vision, chest pain, headaches, palpitations, sweats, SOB, PND, orthopnea He is currently taking 12.5 of losartan  States that he takes ibuprofen 800 mg tablets every 1-2 days. Has been taking these the past few days before bed.   Relevant past medical, surgical, family, and social history reviewed and updated as indicated.  Allergies and medications reviewed and updated. Data reviewed: Chart in Epic.   Past Medical History:  Diagnosis Date   Hyperlipidemia     Past Surgical History:  Procedure Laterality Date   HAND SURGERY Right 1998    Social History   Socioeconomic History   Marital status: Married    Spouse name: Not on file   Number of children: Not on file   Years of education: Not on file   Highest education level: 12th grade  Occupational History   Not on file  Tobacco Use   Smoking status: Former    Current packs/day: 0.50    Average packs/day: 0.5 packs/day for 31.0 years (15.5 ttl pk-yrs)    Types: Cigarettes   Smokeless tobacco: Never  Vaping Use   Vaping status: Never Used  Substance and Sexual Activity   Alcohol use: No   Drug use: No   Sexual activity: Not on file  Other Topics  Concern   Not on file  Social History Narrative   Not on file   Social Drivers of Health   Financial Resource Strain: Low Risk  (10/05/2023)   Overall Financial Resource Strain (CARDIA)    Difficulty of Paying Living Expenses: Not hard at all  Food Insecurity: No Food Insecurity (10/05/2023)   Hunger Vital Sign    Worried About Running Out of Food in the Last Year: Never true    Ran Out of Food in the Last Year: Never true  Transportation Needs: No Transportation Needs (10/05/2023)   PRAPARE - Administrator, Civil Service (Medical): No    Lack of Transportation (Non-Medical): No  Physical Activity: Unknown (10/05/2023)   Exercise Vital Sign    Days of Exercise per Week: 0 days    Minutes of Exercise per Session: Not on file  Stress: No Stress Concern Present (10/05/2023)   [employer] of Occupational Health - Occupational Stress Questionnaire    Feeling of Stress : Not at all  Social Connections: Socially Isolated (10/05/2023)   Social Connection and Isolation Panel [NHANES]    Frequency of Communication with Friends and Family: Once a week    Frequency of Social Gatherings with Friends and Family: Once a week    Attends Religious Services: Never    Production manager of [employer]  or Organizations: No    Attends Banker Meetings: Not on file    Marital Status: Married  Intimate Partner Violence: Unknown (12/20/2021)   [facility] from [facility]   HITS    Physically Hurt: Not on file    Insult or Talk Down To: Not on file    Threaten Physical Harm: Not on file    Scream or Curse: Not on file    Outpatient Encounter Medications as of 10/08/2023  Medication Sig   atorvastatin (LIPITOR) 20 MG tablet Take 1 tablet (20 mg total) by mouth daily.   Blood Glucose Monitoring Suppl (CONTOUR NEXT ONE) DEVI Use to check blood sugars as directed dx E11.9   CINNAMON PO Take 1,000 mg by mouth in the morning, at noon, in the evening, and at bedtime.    losartan (COZAAR) 25 MG tablet Take 0.5 tablets (12.5 mg total) by mouth daily.   tadalafil (CIALIS) 10 MG tablet TAKE 1 TABLET (10 MG TOTAL) BY MOUTH EVERY OTHER DAY AS NEEDED FOR ERECTILE DYSFUNCTION.   No facility-administered encounter medications on file as of 10/08/2023.    Allergies  Allergen Reactions   Rhododendron Other (See Comments)    "blistering"    Review of Systems As per HPI  Objective:  BP (!) 169/83   Pulse 60   Temp 98.2 F (36.8 C)   Ht 5\' 11"  (1.803 m)   Wt 220 lb (99.8 kg)   SpO2 98%   BMI 30.68 kg/m    Wt Readings from Last 3 Encounters:  10/08/23 220 lb (99.8 kg)  10/05/23 218 lb (98.9 kg)  07/01/23 213 lb (96.6 kg)    Physical Exam Constitutional:      General: He is awake. He is not in acute distress.    Appearance: Normal appearance. He is well-developed and well-groomed. He is not ill-appearing, toxic-appearing or diaphoretic.  HENT:     Right Ear: No laceration, drainage, swelling or tenderness. A middle ear effusion is present. There is no impacted cerumen. No foreign body. No mastoid tenderness. No PE tube. No hemotympanum. Tympanic membrane is not injected, scarred, perforated, erythematous, retracted or bulging.     Left Ear: No laceration, drainage, swelling or tenderness. A middle ear effusion is present. There is no impacted cerumen. No foreign body. No mastoid tenderness. No PE tube. No hemotympanum. Tympanic membrane is not injected, scarred, perforated, erythematous, retracted or bulging.     Nose: Nose normal. No congestion.     Right Sinus: No maxillary sinus tenderness or frontal sinus tenderness.     Left Sinus: No maxillary sinus tenderness or frontal sinus tenderness.     Mouth/Throat:     Lips: Pink. No lesions.  Cardiovascular:     Rate and Rhythm: Normal rate and regular rhythm.     Pulses: Normal pulses.          Radial pulses are 2+ on the right side and 2+ on the left side.       Posterior tibial pulses are 2+ on the  right side and 2+ on the left side.     Heart sounds: Normal heart sounds. No murmur heard.    No gallop.  Pulmonary:     Effort: Pulmonary effort is normal. No respiratory distress.     Breath sounds: Normal breath sounds. No stridor. No wheezing, rhonchi or rales.  Musculoskeletal:     Cervical back: Full passive range of motion without pain and neck supple.  Right lower leg: No edema.     Left lower leg: No edema.  Lymphadenopathy:     Head:     Right side of head: Tonsillar adenopathy present. No submental, submandibular, preauricular or posterior auricular adenopathy.     Left side of head: Tonsillar adenopathy present. No submental, submandibular, preauricular or posterior auricular adenopathy.     Cervical:     Right cervical: No superficial, deep or posterior cervical adenopathy.    Left cervical: No superficial, deep or posterior cervical adenopathy.     Upper Body:     Right upper body: No supraclavicular adenopathy.     Left upper body: No supraclavicular adenopathy.  Skin:    General: Skin is warm.     Capillary Refill: Capillary refill takes less than 2 seconds.  Neurological:     General: No focal deficit present.     Mental Status: He is alert, oriented to person, place, and time and easily aroused. Mental status is at baseline.     GCS: GCS eye subscore is 4. GCS verbal subscore is 5. GCS motor subscore is 6.     Motor: No weakness.  Psychiatric:        Attention and Perception: Attention and perception normal.        Mood and Affect: Mood and affect normal.        Speech: Speech normal.        Behavior: Behavior normal. Behavior is cooperative.        Thought Content: Thought content normal. Thought content does not include homicidal or suicidal ideation. Thought content does not include homicidal or suicidal plan.        Cognition and Memory: Cognition and memory normal.        Judgment: Judgment normal.     Results for orders placed or performed in visit  on 10/05/23  Bayer DCA Hb A1c Waived   Collection Time: 10/05/23  9:53 AM  Result Value Ref Range   HB A1C (BAYER DCA - WAIVED) 6.6 (H) 4.8 - 5.6 %       10/08/2023    9:36 AM 10/05/2023   10:04 AM 07/01/2023    9:41 AM 07/01/2023    9:40 AM 03/30/2023    9:41 AM  Depression screen PHQ 2/9  Decreased Interest 0 0  0   Down, Depressed, Hopeless 0 0  0   PHQ - 2 Score 0 0  0   Altered sleeping 0  0  0  Tired, decreased energy 0  0  0  Change in appetite 0  0  0  Feeling bad or failure about yourself  0  0  0  Trouble concentrating 0  0  0  Moving slowly or fidgety/restless 0  0  0  Suicidal thoughts 0  0  0  PHQ-9 Score 0      Difficult doing work/chores Not difficult at all  Not difficult at all  Not difficult at all       10/08/2023    9:37 AM 07/01/2023    9:40 AM 03/30/2023    9:41 AM 12/24/2022   10:02 AM  GAD 7 : Generalized Anxiety Score  Nervous, Anxious, on Edge 0 0 0 0  Control/stop worrying 0 0 0 0  Worry too much - different things 0 0 0 0  Trouble relaxing 0 0 0 0  Restless 0 0 0 0  Easily annoyed or irritable 0 0 0 0  Afraid - awful  might happen 0 0 0 0  Total GAD 7 Score 0 0 0 0  Anxiety Difficulty Not difficult at all Not difficult at all Not difficult at all Not difficult at all   Pertinent labs & imaging results that were available during my care of the patient were reviewed by me and considered in my medical decision making.  Assessment & Plan:  Keith "[name]" was seen today for hypertension.  Diagnoses and all orders for this visit:  1. Hypertension, essential (Primary) Reviewed notes from Dettinger, MD on 10/05/23. Elevated BP today in office. Discussed with patient monitoring BP at home. Instructed pt to take BP first thing in the morning after sitting for 5 minutes with feet flat on the floor, arm at heart level. Patient has monitor at home. Instructed patient to increase dose of losartan as below. Instructed him to send BP measurements in 2 weeks  to PCP for review and additional follow up. Provided education on DASH diet and avoiding use of NSAIDs.  - losartan (COZAAR) 25 MG tablet; Take 1 tablet (25 mg total) by mouth daily.  2. Swollen lymph nodes Discussed with patient that he likely reactive. Encouraged him to start OTC antihistamine such as allegra and to follow up if symptoms continue after 2 weeks.   3. Acute middle ear effusion, bilateral As above   Continue all other maintenance medications.  Follow up plan: Return if symptoms worsen or fail to improve.   Continue healthy lifestyle choices, including diet (rich in fruits, vegetables, and lean proteins, and low in salt and simple carbohydrates) and exercise (at least 30 minutes of moderate physical activity daily).  Written and verbal instructions provided   The above assessment and management plan was discussed with the patient. The patient verbalized understanding of and has agreed to the management plan. Patient is aware to call the clinic if they develop any new symptoms or if symptoms persist or worsen. Patient is aware when to return to the clinic for a follow-up visit. Patient educated on when it is appropriate to go to the emergency department.   Neale Burly, DNP-FNP [facility] [address] [place], [place] [address] [phone]

## 2023-10-08 NOTE — Telephone Encounter (Signed)
Copied from CRM [id number]. Topic: Clinical - Red Word Triage >> Oct 08, 2023  8:12 AM Geroge Zimmerman wrote: Red Word that prompted transfer to Nurse Triage: Blood pressure unstable, pressure in throat feeling like heartbeat is in his threat, lymph nodes swollen in the neck.   Chief Complaint: Blood Pressure-High Symptoms: Headache, and Throbbing Sensation in Throat Frequency: Wednesday Pertinent Negatives: Patient denies Chest Pain, Dyspnea, or neurological deficit.  Disposition: [] ED /[] Urgent Care (no appt availability in office) / [x] Appointment(In office/virtual)/ []  [place] [facility]/ [] Home Care/ [] Refused Recommended Disposition /[] [place] [facility]/ []  Follow-up with PCP Additional Notes: Keith Zimmerman is a 63 year old male being triaged for blood pressure instability, headache, and swollen lymph nodes. The patient denies  a history of hypertension, but confirms a history of diabetes that fluctuates in control. The patient is without chest pain, dyspnea, or neurological changes. Also reports shock like finger pain that is present when he bends over to grab an object. The patient confirmed taking three blood pressure measurements a minute apart. Educated patient on how to take blood pressure measurement in appropriate increments for best accuracy. 151/77 was the first result. Each result after averaged 140s systolic. In office appointment made for today.    Reason for Disposition  [1] Systolic BP  >= 130 OR Diastolic >= 80 AND [2] taking BP medications  Answer Assessment - Initial Assessment Questions 1. BLOOD PRESSURE: "What is the blood pressure?" "Did you take at least two measurements 5 minutes apart?"     151/77.  2. ONSET: "When did you take your blood pressure?"     This morning  3. HOW: "How did you take your blood pressure?" (e.g., automatic home BP monitor, visiting nurse)      Automatic BP Monitor  4. HISTORY: "Do you have a history of high blood pressure?"      Yes  5. MEDICINES: "Are you taking any medicines for blood pressure?" "Have you missed any doses recently?"     Losartan  6. OTHER SYMPTOMS: "Do you have any symptoms?" (e.g., blurred vision, chest pain, difficulty breathing, headache, weakness)     Headache (Heartbeat like sensation), Shock like pain in fingers,  Protocols used: Blood Pressure - High-A-AH

## 2023-10-08 NOTE — Patient Instructions (Addendum)
Monitoring your BP at home   Your BP was elevated today in office  Please keep a log of your BP at home.  The best time to take BP is 1st thing in the morning after waking.  Sit for 5 minutes with feet flat on the floor, arm at heart level.  Options for BP cuffs are at [employer], [employer], [facility], [facility] & [facility].  We will review measurements at follow up and determine plan for BP management.  If you have access, you can send a message in MyChart with your measurements prior to your follow up appointment.  The brand I recommend to get is Omron (Bronze).   Send measurements to PCP in 2 weeks.   Take allegra at night. If lymph nodes do not improve in 2 weeks follow up

## 2023-10-19 ENCOUNTER — Telehealth: Payer: Self-pay | Admitting: Family Medicine

## 2023-10-19 MED ORDER — LOSARTAN POTASSIUM 50 MG PO TABS: 50.0000 mg | ORAL_TABLET | Freq: Every day | ORAL | 1 refills | Status: DC

## 2023-10-19 NOTE — Telephone Encounter (Signed)
Copied from CRM [id number]. Topic: Clinical - Medical Advice >> Oct 19, 2023  8:37 AM Keith Zimmerman wrote: Reason for CRM: Patient says the dosage for his losartan was recently increased but wants to let Dr. Louanne Skye know his blood pressure is still running high, he has been keeping a record of it. The most recent reading of 133/76 was taking this morning which he noticed the readings in the morning are lower but it was 153/84 on yesterday and then 155/94. He would like a call back at [phone] to discuss if he needs to schedule another appointment or what Dr. Louanne Skye would like to do.

## 2023-10-19 NOTE — Telephone Encounter (Signed)
Pt made aware and understood. [facility] [place].

## 2023-10-19 NOTE — Telephone Encounter (Signed)
Have him increase his losartan to 50 mg and send a new prescription for him to the pharmacy of losartan 50 mg daily and give him 6 months worth.

## 2023-10-21 ENCOUNTER — Telehealth: Payer: Self-pay

## 2023-10-21 ENCOUNTER — Ambulatory Visit: Payer: Self-pay | Admitting: Family Medicine

## 2023-10-21 NOTE — Telephone Encounter (Signed)
 Pt called in for his medication   losartan  (COZAAR ) 50    Last does was taken last night around 11:00pm   Pressure in head , temple , neck  (states feels like his head is about to pop )   Current bp reading on the phone was 148/87  Would like to know what he needs to do

## 2023-10-21 NOTE — Telephone Encounter (Signed)
 Sounds like the headache is coming from something else because the blood pressures are not high enough to cause the headache, and no that is what he thought before but it sounds like something else is going on with the headaches.  If he wants to come in to get a Toradol  30 mg injection and I am more than happy to have him come in and see the nurse to do that but if he continues with headaches despite us  adjusting for the blood pressure then he may need to be seen again, may have to do his some imaging.

## 2023-10-21 NOTE — Telephone Encounter (Signed)
  Chief Complaint: high BP Symptoms: feels pressure in temple/neck  Frequency: comes and goes   Disposition: [] ED /[] Urgent Care (no appt availability in office) / [] Appointment(In office/virtual)/ []  [facility]/ [] Home Care/ [] Refused Recommended Disposition /[] [place] [facility]/ [x]  Follow-up with PCP Additional Notes: Pt complaining of the feeling of pressure in neck/temple area like he is going to pass out. Pt denies headache, SOB, and chest pain. Pt stated his Cozaar  was increased from 12.5mg  to 50mg  on 2/3. Has taken 3 doses so far. Pt stated he hasn't felt like this  until the increase. RN called [facility] and nurse asked for pt to be transferred to her. No care advice and dispo given.              Copied from CRM [id number]. Topic: Clinical - Red Word Triage >> Oct 21, 2023 10:38 AM Elle L wrote: Red Word that prompted transfer to Nurse Triage: The patient states he feels like he's about to pass out, dizzy, and his head is about to pop from his high blood pressure 154/80. Reason for Disposition  Ran out of BP medications  Answer Assessment - Initial Assessment Questions 1. BLOOD PRESSURE: What is the blood pressure? Did you take at least two measurements 5 minutes apart?     154/80; 134/77 2. ONSET: When did you take your blood pressure?     This morning  3. HOW: How did you take your blood pressure? (e.g., automatic home BP monitor, visiting nurse)     Automatic  4. HISTORY: Do you have a history of high blood pressure?     Yes  5. MEDICINES: Are you taking any medicines for blood pressure? Have you missed any doses recently?     Cozaar  50mg   6. OTHER SYMPTOMS: Do you have any symptoms? (e.g., blurred vision, chest pain, difficulty breathing, headache, weakness)      Feels pressure in neck and temple  Protocols used: Blood Pressure - High-A-AH

## 2023-10-22 ENCOUNTER — Ambulatory Visit (INDEPENDENT_AMBULATORY_CARE_PROVIDER_SITE_OTHER): Payer: BC Managed Care – PPO

## 2023-10-22 DIAGNOSIS — R52 Pain, unspecified: Secondary | ICD-10-CM

## 2023-10-22 MED ORDER — KETOROLAC TROMETHAMINE 30 MG/ML IJ SOLN
30.0000 mg | Freq: Once | INTRAMUSCULAR | Status: AC
  Administered 2023-10-22: 30 mg via INTRAMUSCULAR

## 2023-10-22 NOTE — Progress Notes (Signed)
 Toradol  30 mg  injection per PCP

## 2023-10-30 ENCOUNTER — Ambulatory Visit: Payer: Self-pay | Admitting: Family Medicine

## 2023-10-30 NOTE — Telephone Encounter (Signed)
Chief Complaint: headache/lightheadedness Symptoms: pressure in head, lightheadedness Frequency: a few weeks Pertinent Negatives: Patient denies chest pain, nausea, vomiting, difficulty breathing Disposition: [] ED /[] Urgent Care (no appt availability in office) / [x] Appointment(In office/virtual)/ []  [facility]/ [] Home Care/ [] Refused Recommended Disposition /[] [place] [facility]/ []  Follow-up with PCP Additional Notes: Patient called in stating he was seen last week and received a Toradol injection for pain, and started feeling better but yesterday started feeling bad again. Patient states his bp medication - Losartan - was recently increased to 50 mg last week. He is uncertain if his symptoms are related to this increase in dosage. Patient denies chest pain, nausea, vomiting, difficulty breathing. Patient states he primarily feels lightheaded when he stands up after sitting for a long time, or over-exertion like walking up the stairs. Patient also states sometimes his left hand will go numb if he is holding his arm above his head or holding something for too long. Patient reported a bp reading for today of 155/85. Patient appt made for follow up for Monday. Advised patient to keep a log for today through Monday of his blood pressure in the AM and PM, and also a symptom log of when he is feeling lightheaded and precursors. Advised patient if he feels faint to call 911 or go to ED. Patient verbalized understanding.    Copied from CRM [id number]. Topic: Clinical - Red Word Triage >> Oct 30, 2023 11:16 AM Higinio Roger wrote: Red Word that prompted transfer to Nurse Triage: High Blood Pressure  Patient states his BP has been going down and received a shot a week ago. Now he is feeling lightheaded, dizzy, pressure in neck, has to keep swallowing to pop eardrums.   BP Readings: 155/85 and pulse: 65; 144/81 (sitting) Reason for Disposition  [1] MODERATE dizziness (e.g., interferes with normal  activities) AND [2] has been evaluated by doctor (or NP/PA) for this  Answer Assessment - Initial Assessment Questions 1. DESCRIPTION: "Describe your dizziness."     "Feel lightheaded and pressure like high altitude" 2. LIGHTHEADED: "Do you feel lightheaded?" (e.g., somewhat faint, woozy, weak upon standing)     Pressure in head, sometimes I feel like I am going to faint or weak 3. VERTIGO: "Do you feel like either you or the room is spinning or tilting?" (i.e. vertigo)     No 4. SEVERITY: "How bad is it?"  "Do you feel like you are going to faint?" "Can you stand and walk?"   - MILD: Feels slightly dizzy, but walking normally.   - MODERATE: Feels unsteady when walking, but not falling; interferes with normal activities (e.g., school, work).   - SEVERE: Unable to walk without falling, or requires assistance to walk without falling; feels like passing out now.      Moderate 5. ONSET:  "When did the dizziness begin?"     A few weeks  6. AGGRAVATING FACTORS: "Does anything make it worse?" (e.g., standing, change in head position)     Changing positions after a long period of time  7. HEART RATE: "Can you tell me your heart rate?" "How many beats in 15 seconds?"  (Note: not all patients can do this)       65 - 80 8. CAUSE: "What do you think is causing the dizziness?"     Unsure if it's due to medication or blood pressure 9. RECURRENT SYMPTOM: "Have you had dizziness before?" If Yes, ask: "When was the last time?" "What happened that time?"  No 10. OTHER SYMPTOMS: "Do you have any other symptoms?" (e.g., fever, chest pain, vomiting, diarrhea, bleeding)       No  Protocols used: Dizziness - Lightheadedness-A-AH

## 2023-11-02 ENCOUNTER — Encounter: Payer: Self-pay | Admitting: Nurse Practitioner

## 2023-11-02 ENCOUNTER — Ambulatory Visit (INDEPENDENT_AMBULATORY_CARE_PROVIDER_SITE_OTHER): Payer: BC Managed Care – PPO | Admitting: Nurse Practitioner

## 2023-11-02 VITALS — BP 132/74 | HR 61 | Temp 97.7°F | Wt 213.0 lb

## 2023-11-02 DIAGNOSIS — R590 Localized enlarged lymph nodes: Secondary | ICD-10-CM

## 2023-11-02 MED ORDER — PREDNISONE 20 MG PO TABS: 40.0000 mg | ORAL_TABLET | Freq: Every day | ORAL | 0 refills | Status: AC

## 2023-11-02 MED ORDER — AZITHROMYCIN 250 MG PO TABS: ORAL_TABLET | ORAL | 0 refills | Status: DC

## 2023-11-02 NOTE — Progress Notes (Signed)
   Subjective:    Patient ID: Keith Zimmerman, male    DOB: Nov 13, 1960, 63 y.o.   MRN: [id number]   Chief Complaint: pressure in neck  (Been going on weeks, glands in neck swollen everytime this happens )   Patient says the glands in his neck are swollen. Has been going on for a  couple of weeks. Denies pain, but feels like they are full. Ears wont pop. Feels pressure in neck.  Was seen by Abner Greenspan , FNP on 10/08/23. He was dx with hypertension and reactive lymph edema in neck. He came in and got toradol injection last week which really did not help.    Patient Active Problem List   Diagnosis Date Noted   Hypertension, essential 03/30/2023   Diabetes mellitus (HCC) 09/17/2022   Mixed dyslipidemia 12/29/2017   Former smoker 12/29/2017       Review of Systems  Constitutional:  Negative for chills, fatigue and fever.  HENT:  Negative for congestion.   Respiratory:  Negative for cough and shortness of breath.        Objective:   Physical Exam Constitutional:      Appearance: Normal appearance.  Cardiovascular:     Rate and Rhythm: Normal rate and regular rhythm.     Heart sounds: Normal heart sounds.  Pulmonary:     Effort: Pulmonary effort is normal.     Breath sounds: Normal breath sounds.  Lymphadenopathy:     Cervical: No cervical adenopathy (anterior cervical bil).  Skin:    General: Skin is warm.  Neurological:     General: No focal deficit present.     Mental Status: He is alert and oriented to person, place, and time.  Psychiatric:        Mood and Affect: Mood normal.        Behavior: Behavior normal.       BP 132/74   Pulse 61   Temp 97.7 F (36.5 C) (Temporal)   Wt 213 lb (96.6 kg)   SpO2 99%   BMI 29.71 kg/m      Assessment & Plan:   Keith Zimmerman in today with chief complaint of pressure in neck  (Been going on weeks, glands in neck swollen everytime this happens )   1. Anterior cervical lymphadenopathy (Primary) Warm compresses RTO if  not improving - azithromycin (ZITHROMAX Z-PAK) 250 MG tablet; As directed  Dispense: 6 tablet; Refill: 0 - predniSONE (DELTASONE) 20 MG tablet; Take 2 tablets (40 mg total) by mouth daily with breakfast for 5 days. 2 po daily for 5 days  Dispense: 10 tablet; Refill: 0    The above assessment and management plan was discussed with the patient. The patient verbalized understanding of and has agreed to the management plan. Patient is aware to call the clinic if symptoms persist or worsen. Patient is aware when to return to the clinic for a follow-up visit. Patient educated on when it is appropriate to go to the emergency department.   Mary-Margaret Daphine Deutscher, FNP

## 2023-11-02 NOTE — Patient Instructions (Signed)
 Lymphadenopathy  Lymphadenopathy is when your lymph glands are swollen or larger than normal.  Lymph glands, also called lymph nodes, are clumps of tissue. They filter germs and waste from tissues in your body to your bloodstream. They're part of your body's defense system, or immune system. Lymphadenopathy has different causes, like infection, autoimmune disease, and cancer. Lymphadenopathy can happen wherever you have lymph nodes. The type you have depends on which nodes it's in, such as: Cervical lymphadenopathy. This is in the neck. Mediastinal lymphadenopathy. This is in the chest. Hilar lymphadenopathy. This is in the lungs. Axillary lymphadenopathy. This is in the armpits. Inguinal lymphadenopathy. This is in the groin. Sometimes, fluid and cells that fight infection build up in your lymph nodes. This happens when your immune system reacts to germs or other substances that get into your body. This makes lymph nodes swell and get bigger. Treatment is based on what's thought to be the cause. Sometimes, lymph nodes don't go back to normal size after treatment. If yours don't, your health care provider may order tests to help learn why your glands are still swollen and big. Follow these instructions at home:  Take over-the-counter and prescription medicines only as told by your provider. If you were prescribed antibiotics, do not stop using them, even if you start to feel better. If told, apply heat to swollen lymph nodes as told by your provider. Use the heat source that your provider recommends, such as a moist heat pack or a heating pad. Place a towel between your skin and the heat source. Leave the heat on only for the time told by your provider to avoid injury. If your skin turns bright red, remove the heat right away to prevent burns. The risk of burns is higher if you cannot feel pain, heat, or cold. Check your swollen lymph nodes every day for changes. Check other places where you have  lymph nodes as told. Check for changes such as: More swelling. Sudden growth in size. Redness or pain. Hardness. Contact a health care provider if: You have lymph nodes that: Are still swollen after 2 weeks. Have gotten bigger all of a sudden or the swelling spreads. Are red, painful, or hard. Fluid leaks from the skin near a swollen lymph node. You get a fever, chills, or night sweats. You feel tired. You have a sore throat. Your abdomen hurts. You lose weight without trying. This information is not intended to replace advice given to you by your health care provider. Make sure you discuss any questions you have with your health care provider. Document Revised: 11/26/2022 Document Reviewed: 11/26/2022 Elsevier Patient Education  2024 [employer].

## 2023-11-18 ENCOUNTER — Ambulatory Visit: Admitting: Nurse Practitioner

## 2023-11-18 ENCOUNTER — Encounter: Payer: Self-pay | Admitting: Nurse Practitioner

## 2023-11-18 VITALS — BP 136/73 | HR 69 | Temp 98.3°F | Ht 71.0 in | Wt 216.6 lb

## 2023-11-18 DIAGNOSIS — R590 Localized enlarged lymph nodes: Secondary | ICD-10-CM | POA: Diagnosis not present

## 2023-11-18 NOTE — Progress Notes (Unsigned)
   Acute Office Visit  Subjective:     Patient ID: Chukwudi Ewen, male    DOB: 01-13-1961, 63 y.o.   MRN: [id number]  Chief Complaint  Patient presents with   Adenopathy    Swollen lymph nodes for a month causing neck pain, was given zpack before and helped some     HPI Was seen multiple time for the same issues  Spoke to his PCP on th ephone and he order an IM injection Millian FNP 10/08/2023 and was dx with HTN was dx with  Cozaar  and advise to start OTC antihistemine Gennaro Africa FNP 11/02/2023 prescribe z-pack and prdnisone    " If I exert myself too much I have to stop I feel like my neck is about to explode. I =f I am laying down, I am find, once I am standing up a ROS Negative unless indicated in HPI    Objective:    BP 136/73   Pulse 69   Temp 98.3 F (36.8 C) (Temporal)   Ht 5\' 11"  (1.803 m)   Wt 216 lb 9.6 oz (98.2 kg)   SpO2 95%   BMI 30.21 kg/m  {Vitals History (Optional):23777}  Physical Exam  No results found for any visits on 11/18/23.      Assessment & Plan:  There are no diagnoses linked to this encounter.  No follow-ups on file.  @Niala Stcharles  Janee Morn DNP@  Note: This document was prepared by [employer] voice dictation technology and any errors that results from this process are unintentional.

## 2023-11-19 LAB — CBC WITH DIFFERENTIAL/PLATELET
Basophils Absolute: 0.1 10*3/uL (ref 0.0–0.2)
Basos: 1 %
EOS (ABSOLUTE): 0.2 10*3/uL (ref 0.0–0.4)
Eos: 2 %
Hematocrit: 42.8 % (ref 37.5–51.0)
Hemoglobin: 14.5 g/dL (ref 13.0–17.7)
Immature Grans (Abs): 0 10*3/uL (ref 0.0–0.1)
Immature Granulocytes: 1 %
Lymphocytes Absolute: 1.7 10*3/uL (ref 0.7–3.1)
Lymphs: 22 %
MCH: 30.1 pg (ref 26.6–33.0)
MCHC: 33.9 g/dL (ref 31.5–35.7)
MCV: 89 fL (ref 79–97)
Monocytes Absolute: 0.8 10*3/uL (ref 0.1–0.9)
Monocytes: 10 %
Neutrophils Absolute: 5 10*3/uL (ref 1.4–7.0)
Neutrophils: 64 %
Platelets: 179 10*3/uL (ref 150–450)
RBC: 4.81 x10E6/uL (ref 4.14–5.80)
RDW: 12.4 % (ref 11.6–15.4)
WBC: 7.7 10*3/uL (ref 3.4–10.8)

## 2023-11-19 LAB — MONO QUAL W/RFLX QN

## 2023-11-30 ENCOUNTER — Ambulatory Visit: Payer: Self-pay | Admitting: Family Medicine

## 2023-11-30 NOTE — Telephone Encounter (Signed)
 Appt scheduled for 3/19.

## 2023-11-30 NOTE — Telephone Encounter (Signed)
 Chief Complaint: hyperglycemia Symptoms:  elevated blood glucose, 2 episodes of diarrhea over past 3 days Frequency: occurring since end of February Pertinent Negatives: Patient denies chest pain, SOB, nausea, vomiting. Disposition: [] ED /[] Urgent Care (no appt availability in office) / [] Appointment(In office/virtual)/ []  [facility]/ [] Home Care/ [] Refused Recommended Disposition /[] [facility]/ [x]  Follow-up with PCP Additional Notes: Patient states a little over a month ago he felt like the glands in his throat were swollen, fatigue, SOB. He was told he might have an infection and was placed on Z pack and prednisone. He states it did not help. He states he fell off track with his diet; started eating snack foods and things he shouldn't have. He states on 2/28 his BG was in the 200s. He states he was on metformin and Farxiga in the past which helped with weightloss and his HA1C but was taken off the medications. He states he started back on his metformin twice daily (leftover pills he had) 3 days ago and states he is feeling better. He states the neck swelling, SOB, and increased thirst improved.  He also states he had one day his BP was 165/96 on March 7th, most recent Bp 118/80s. He states the day his BP was elevated was when he was having the neck pain and swelling. Patient asking if he needs to come in for office visit or to have his HA1C checked and wants Dr Dettinger to know he started taking his metformin again. He states he is available Wednesday if he needs to come in.  Copied From CRM [id number]. Reason for Triage: BP high and throat.Marland Kitchen lymph nodes swelling.. blood sugar was at [address] need medication prescription?  Pls call him to advice [phone]   Reason for Disposition  [1] Caller has NON-URGENT medication or insulin pump question AND [2] triager unable to answer question  Answer Assessment - Initial Assessment Questions 1. BLOOD GLUCOSE: "What  is your blood glucose level?"      115.  2. ONSET: "When did you check the blood glucose?"     March 15th.  3. USUAL RANGE: "What is your glucose level usually?" (e.g., usual fasting morning value, usual evening value)     140-150 is his average lately.  4. KETONES: "Do you check for ketones (urine or blood test strips)?" If Yes, ask: "What does the test show now?"      Denies.  5. TYPE 1 or 2:  "Do you know what type of diabetes you have?"  (e.g., Type 1, Type 2, Gestational; doesn't know)      Type 2 diabetic.  6. INSULIN: "Do you take insulin?" "What type of insulin(s) do you use? What is the mode of delivery? (syringe, pen; injection or pump)?"      Denies.  7. DIABETES PILLS: "Do you take any pills for your diabetes?" If Yes, ask: "Have you missed taking any pills recently?"     He states he was on metformin in the past twice daily and Dr Dettinger took him off. He states he put himself back on the metformin 3 days ago.  8. OTHER SYMPTOMS: "Do you have any symptoms?" (e.g., fever, frequent urination, difficulty breathing, dizziness, weakness, vomiting)     Increased thirst, SOB, neck swelling (states a lot of this has improved since started back on metformin).  9. PREGNANCY: "Is there any chance you are pregnant?" "When was your last menstrual period?"     N/A.  Protocols  used: Diabetes - High Blood Sugar-A-AH

## 2023-11-30 NOTE — Telephone Encounter (Signed)
 Make appt asap so we can look at his neck swelling and figure out what is going on

## 2023-11-30 NOTE — Telephone Encounter (Signed)
 Left detailed message for pt to call back to set up apt asap

## 2023-12-02 ENCOUNTER — Encounter: Payer: Self-pay | Admitting: Family Medicine

## 2023-12-02 ENCOUNTER — Ambulatory Visit (HOSPITAL_COMMUNITY)
Admission: RE | Admit: 2023-12-02 | Discharge: 2023-12-02 | Disposition: A | Discharge status: Home / Self Care | Source: Ambulatory Visit | Attending: Family Medicine | Admitting: Family Medicine

## 2023-12-02 ENCOUNTER — Ambulatory Visit

## 2023-12-02 VITALS — BP 127/67 | HR 69 | Temp 97.5°F | Ht 71.0 in | Wt 215.4 lb

## 2023-12-02 DIAGNOSIS — R221 Localized swelling, mass and lump, neck: Secondary | ICD-10-CM | POA: Diagnosis not present

## 2023-12-02 DIAGNOSIS — M47812 Spondylosis without myelopathy or radiculopathy, cervical region: Secondary | ICD-10-CM | POA: Diagnosis not present

## 2023-12-02 DIAGNOSIS — M47813 Spondylosis without myelopathy or radiculopathy, cervicothoracic region: Secondary | ICD-10-CM | POA: Diagnosis not present

## 2023-12-02 DIAGNOSIS — E1169 Type 2 diabetes mellitus with other specified complication: Secondary | ICD-10-CM | POA: Diagnosis not present

## 2023-12-02 DIAGNOSIS — M4802 Spinal stenosis, cervical region: Secondary | ICD-10-CM | POA: Diagnosis not present

## 2023-12-02 DIAGNOSIS — Z7984 Long term (current) use of oral hypoglycemic drugs: Secondary | ICD-10-CM | POA: Diagnosis not present

## 2023-12-02 LAB — CMP14+EGFR
ALT: 40 IU/L (ref 0–44)
AST: 24 IU/L (ref 0–40)
Albumin: 4.6 g/dL (ref 3.9–4.9)
Alkaline Phosphatase: 98 IU/L (ref 44–121)
BUN/Creatinine Ratio: 30 — ABNORMAL HIGH (ref 10–24)
BUN: 29 mg/dL — ABNORMAL HIGH (ref 8–27)
Bilirubin Total: 1 mg/dL (ref 0.0–1.2)
CO2: 19 mmol/L — ABNORMAL LOW (ref 20–29)
Calcium: 9.5 mg/dL (ref 8.6–10.2)
Chloride: 105 mmol/L (ref 96–106)
Creatinine, Ser: 0.98 mg/dL (ref 0.76–1.27)
Globulin, Total: 2 g/dL (ref 1.5–4.5)
Glucose: 112 mg/dL — ABNORMAL HIGH (ref 70–99)
Potassium: 4.1 mmol/L (ref 3.5–5.2)
Sodium: 140 mmol/L (ref 134–144)
Total Protein: 6.6 g/dL (ref 6.0–8.5)
eGFR: 88 mL/min/{1.73_m2} (ref >59)

## 2023-12-02 LAB — CBC WITH DIFFERENTIAL/PLATELET
Basophils Absolute: 0.1 10*3/uL (ref 0.0–0.2)
Basos: 1 %
EOS (ABSOLUTE): 0.2 10*3/uL (ref 0.0–0.4)
Eos: 2 %
Hematocrit: 44.7 % (ref 37.5–51.0)
Hemoglobin: 14.9 g/dL (ref 13.0–17.7)
Immature Grans (Abs): 0 10*3/uL (ref 0.0–0.1)
Immature Granulocytes: 1 %
Lymphocytes Absolute: 1.8 10*3/uL (ref 0.7–3.1)
Lymphs: 22 %
MCH: 30 pg (ref 26.6–33.0)
MCHC: 33.3 g/dL (ref 31.5–35.7)
MCV: 90 fL (ref 79–97)
Monocytes Absolute: 0.8 10*3/uL (ref 0.1–0.9)
Monocytes: 10 %
Neutrophils Absolute: 5.4 10*3/uL (ref 1.4–7.0)
Neutrophils: 64 %
Platelets: 184 10*3/uL (ref 150–450)
RBC: 4.96 x10E6/uL (ref 4.14–5.80)
RDW: 12.8 % (ref 11.6–15.4)
WBC: 8.2 10*3/uL (ref 3.4–10.8)

## 2023-12-02 LAB — POCT I-STAT CREATININE: Creatinine, Ser: 1 mg/dL (ref 0.61–1.24)

## 2023-12-02 MED ORDER — IOHEXOL 300 MG/ML  SOLN
75.0000 mL | Freq: Once | INTRAMUSCULAR | Status: AC | PRN
  Administered 2023-12-02: 75 mL via INTRAVENOUS

## 2023-12-02 NOTE — Progress Notes (Signed)
 Subjective:  Patient ID: Keith Zimmerman, male    DOB: [birth year], 63 y.o.   MRN: [id number]  Patient Care Team: Dettinger, Elige Radon, MD as PCP - General (Family Medicine)   Chief Complaint:  neck swelling (X 1 month)   HPI: Keith Zimmerman is a 63 y.o. male presenting on 12/02/2023 for neck swelling (X 1 month)   Discussed the use of AI scribe software for clinical note transcription with the patient, who gave verbal consent to proceed.  History of Present Illness   Keith Alyea "[name]" is a 63 year old male who presents with neck swelling.  He has been experiencing neck swelling for over a month, primarily on the left side. He describes a sensation similar to being at high altitude, with ears feeling like they are popping, necessitating frequent swallowing. There is no difficulty swallowing, but the sensation is bothersome, especially during physical activity or prolonged conversations, causing him to pause due to a sensation of increased blood pressure. Previous tests, including a mono test and CBC, were conducted by another provider, and results were normal. He was previously treated with prednisone and a Z-Pak for a suspected infection, which he believes affected his blood sugar levels. No sore throat, headache, night sweats, weight loss, abnormal bleeding, bruising, or visual changes.  He has a history of diabetes, initially diagnosed with an A1c over 12. He was started on metformin and Farxiga, but discontinued Marcelline Deist due to cost. His A1c improved to 5.8 while on metformin, but increased to 6.8 after stopping it. He resumed metformin recently due to concerns about elevated blood sugars while on prednisone, but has a limited supply remaining. Blood sugars occasionally exceed 200, particularly while on prednisone.  He reports high blood pressure, especially during physical exertion or prolonged conversations. No specific blood pressure readings are provided, but symptoms suggest  poorly controlled hypertension.  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          Relevant past medical, surgical, family, and social history reviewed and updated as indicated.  Allergies and medications reviewed and updated. Data reviewed: Chart in Epic.   Past Medical History:  Diagnosis Date   Hyperlipidemia     Past Surgical History:  Procedure Laterality Date   HAND SURGERY Right 1998    Social History   Socioeconomic History   Marital status: Married    Spouse name: Not on file   Number of children: Not on file   Years of education: Not on file   Highest education level: 12th grade  Occupational History   Not on file  Tobacco Use   Smoking status: Former    Current packs/day: 0.50    Average packs/day: 0.5 packs/day for 31.0 years (15.5 ttl pk-yrs)    Types: Cigarettes   Smokeless tobacco: Never  Vaping Use   Vaping status: Never Used  Substance and Sexual Activity   Alcohol use: No   Drug use: No   Sexual activity: Not on file  Other Topics Concern   Not on file  Social History Narrative   Not on file   Social Drivers of Health   Financial Resource Strain: Low Risk  (10/05/2023)   Overall Financial Resource Strain (CARDIA)    Difficulty of Paying Living Expenses: Not hard at all  Food Insecurity: No Food Insecurity (10/05/2023)   Hunger Vital Sign    Worried About Running Out of Food in the Last Year: Never  true    Ran Out of Food in the Last Year: Never true  Transportation Needs: No Transportation Needs (10/05/2023)   PRAPARE - Administrator, Civil Service (Medical): No    Lack of Transportation (Non-Medical): No  Physical Activity: Unknown (10/05/2023)   Exercise Vital Sign    Days of Exercise per Week: 0 days    Minutes of Exercise per Session: Not on file  Stress: No Stress Concern Present (10/05/2023)   [employer] of Occupational Health - Occupational Stress  Questionnaire    Feeling of Stress : Not at all  Social Connections: Socially Isolated (10/05/2023)   Social Connection and Isolation Panel [NHANES]    Frequency of Communication with Friends and Family: Once a week    Frequency of Social Gatherings with Friends and Family: Once a week    Attends Religious Services: Never    Database administrator or Organizations: No    Attends Engineer, structural: Not on file    Marital Status: Married  Intimate Partner Violence: Unknown (12/20/2021)   Received from [employer], [facility]   HITS    Physically Hurt: Not on file    Insult or Talk Down To: Not on file    Threaten Physical Harm: Not on file    Scream or Curse: Not on file    Outpatient Encounter Medications as of 12/02/2023  Medication Sig   atorvastatin (LIPITOR) 20 MG tablet Take 1 tablet (20 mg total) by mouth daily.   Blood Glucose Monitoring Suppl (CONTOUR NEXT ONE) DEVI Use to check blood sugars as directed dx E11.9   CINNAMON PO Take 1,000 mg by mouth in the morning, at noon, in the evening, and at bedtime.   losartan (COZAAR) 50 MG tablet Take 1 tablet (50 mg total) by mouth daily.   metFORMIN (GLUCOPHAGE-XR) 500 MG 24 hr tablet Take 500 mg by mouth 2 (two) times daily with a meal.   tadalafil (CIALIS) 10 MG tablet TAKE 1 TABLET (10 MG TOTAL) BY MOUTH EVERY OTHER DAY AS NEEDED FOR ERECTILE DYSFUNCTION.   [DISCONTINUED] azithromycin (ZITHROMAX Z-PAK) 250 MG tablet As directed (Patient not taking: Reported on 11/18/2023)   No facility-administered encounter medications on file as of 12/02/2023.    Allergies  Allergen Reactions   Rhododendron Other (See Comments)    "blistering"    Pertinent ROS per HPI, otherwise unremarkable      Objective:  BP 127/67   Pulse 69   Temp (!) 97.5 F (36.4 C)   Ht 5\' 11"  (1.803 m)   Wt 215 lb 6.4 oz (97.7 kg)   SpO2 98%   BMI 30.04 kg/m    Wt Readings from Last 3 Encounters:  12/02/23 215 lb 6.4 oz (97.7 kg)  11/18/23  216 lb 9.6 oz (98.2 kg)  11/02/23 213 lb (96.6 kg)    Physical Exam Vitals and nursing note reviewed.  Constitutional:      Appearance: Normal appearance. He is obese.  HENT:     Head: Normocephalic and atraumatic.     Nose: Nose normal.     Mouth/Throat:     Mouth: Mucous membranes are moist.     Pharynx: Oropharynx is clear.  Eyes:     Conjunctiva/sclera: Conjunctivae normal.     Pupils: Pupils are equal, round, and reactive to light.  Neck:     Trachea: Trachea and phonation normal.   Cardiovascular:     Rate and Rhythm: Normal rate and regular rhythm.  Heart sounds: Normal heart sounds.  Pulmonary:     Effort: Pulmonary effort is normal.     Breath sounds: Normal breath sounds.  Musculoskeletal:     Cervical back: Full passive range of motion without pain.  Neurological:     Mental Status: He is alert.    Physical Exam   NECK: Neck swelling more prominent on the left side. Left side of neck visibly larger than right.        Results for orders placed or performed in visit on 11/18/23  Mononucleosis Test, Qual W/ Reflex   Collection Time: 11/18/23  4:20 PM  Result Value Ref Range   Mono Qual W/Rflx Qn Negative Negative  CBC with Differential/Platelet   Collection Time: 11/18/23  4:20 PM  Result Value Ref Range   WBC 7.7 3.4 - 10.8 x10E3/uL   RBC 4.81 4.14 - 5.80 x10E6/uL   Hemoglobin 14.5 13.0 - 17.7 g/dL   Hematocrit 81.1 91.4 - 51.0 %   MCV 89 79 - 97 fL   MCH 30.1 26.6 - 33.0 pg   MCHC 33.9 31.5 - 35.7 g/dL   RDW 78.2 95.6 - 21.3 %   Platelets 179 150 - 450 x10E3/uL   Neutrophils 64 Not Estab. %   Lymphs 22 Not Estab. %   Monocytes 10 Not Estab. %   Eos 2 Not Estab. %   Basos 1 Not Estab. %   Neutrophils Absolute 5.0 1.4 - 7.0 x10E3/uL   Lymphocytes Absolute 1.7 0.7 - 3.1 x10E3/uL   Monocytes Absolute 0.8 0.1 - 0.9 x10E3/uL   EOS (ABSOLUTE) 0.2 0.0 - 0.4 x10E3/uL   Basophils Absolute 0.1 0.0 - 0.2 x10E3/uL   Immature Granulocytes 1 Not Estab. %    Immature Grans (Abs) 0.0 0.0 - 0.1 x10E3/uL       Pertinent labs & imaging results that were available during my care of the patient were reviewed by me and considered in my medical decision making.  Assessment & Plan:  Keith "[name]" was seen today for neck swelling.  Diagnoses and all orders for this visit:  Neck swelling -     CT Soft Tissue Neck W Contrast; Future -     CBC with Differential/Platelet -     CMP14+EGFR  Type 2 diabetes mellitus with other specified complication, without long-term current use of insulin (HCC) -     CBC with Differential/Platelet -     CMP14+EGFR     Assessment and Plan    Neck Swelling Chronic neck swelling, more prominent on the left side, ongoing for over a month. No associated sore throat or headache. Previous ultrasound in 2022. Differential includes infection, lymphadenopathy, or other mass. Previous tests including mono test and CBC were normal. Reports sensation of ear popping and need to swallow frequently, but no difficulty swallowing. Physical exertion and prolonged conversation exacerbate symptoms. - Order CT scan of the neck. - Perform blood work including CBC and CMP  Diabetes Mellitus Diabetes with previous A1c of 12.6, reduced to 5.8 on metformin and Farxiga. Currently off Marcelline Deist due to cost and recently restarted metformin due to elevated blood sugars. A1c in January was 6.6. Blood sugars elevated above 180, likely due to recent prednisone use. Informed that steroids can cause temporary increases in blood sugar levels. - Stop metformin and monitor blood sugars - If blood sugars remain elevated, restart metformin and contact Dr. Louanne Skye for refill - Follow up with Dr. Louanne Skye in May  Hypertension Hypertension, especially during physical exertion  or prolonged conversation. No specific blood pressure readings provided. - Monitor blood pressure regularly          Continue all other maintenance medications.  Follow up  plan: Return if symptoms worsen or fail to improve.   Continue healthy lifestyle choices, including diet (rich in fruits, vegetables, and lean proteins, and low in salt and simple carbohydrates) and exercise (at least 30 minutes of moderate physical activity daily).    The above assessment and management plan was discussed with the patient. The patient verbalized understanding of and has agreed to the management plan. Patient is aware to call the clinic if they develop any new symptoms or if symptoms persist or worsen. Patient is aware when to return to the clinic for a follow-up visit. Patient educated on when it is appropriate to go to the emergency department.   Kari Baars, FNP-C [facility] [phone]

## 2023-12-17 ENCOUNTER — Other Ambulatory Visit: Payer: Self-pay

## 2023-12-17 ENCOUNTER — Ambulatory Visit
Admission: EM | Admit: 2023-12-17 | Discharge: 2023-12-17 | Disposition: A | Discharge status: Home / Self Care | Attending: Family Medicine | Admitting: Family Medicine

## 2023-12-17 DIAGNOSIS — Z87898 Personal history of other specified conditions: Secondary | ICD-10-CM | POA: Diagnosis not present

## 2023-12-17 DIAGNOSIS — M5000 Cervical disc disorder with myelopathy, unspecified cervical region: Secondary | ICD-10-CM

## 2023-12-17 DIAGNOSIS — M79602 Pain in left arm: Secondary | ICD-10-CM

## 2023-12-17 HISTORY — DX: Essential (primary) hypertension: I10

## 2023-12-17 HISTORY — DX: Type 2 diabetes mellitus without complications: E11.9

## 2023-12-17 MED ORDER — MELOXICAM 15 MG PO TABS: 15.0000 mg | ORAL_TABLET | Freq: Every day | ORAL | 0 refills | Status: DC
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
## 2023-12-17 NOTE — Discharge Instructions (Signed)
 Take meloxicam 1 pill a day with food This is an arthritis type anti-inflammatory medicine that should help with the neck and arm pain Call your primary care doctor to try to get an appointment sooner than May.  I believe he will refer you to a spine specialist

## 2023-12-17 NOTE — ED Triage Notes (Signed)
 2 months ago started having sob, htn, swollen lymph nodes. Was seen for this and was told it was inflammation. Went back again and was told he had an infection, was put on azithromycin. Was told he needed CT, which he had and was normal. No fevers or sweats. Has been taking aspirin and tylenol at times.

## 2023-12-18 ENCOUNTER — Telehealth: Payer: Self-pay

## 2023-12-18 NOTE — Telephone Encounter (Signed)
-----   Message from Elige Radon Dettinger sent at 12/17/2023  1:16 PM EDT ----- Regarding: neck pain Can you please make sure he has an appointment sooner rather than later with me so we can follow-up, looks like he went and saw specialist Arville Care, MD [facility] 12/17/2023, 1:17 PM

## 2023-12-18 NOTE — Telephone Encounter (Signed)
 Appt made 4/7 at 9:10am

## 2023-12-21 ENCOUNTER — Ambulatory Visit (INDEPENDENT_AMBULATORY_CARE_PROVIDER_SITE_OTHER): Admitting: Family Medicine

## 2023-12-21 ENCOUNTER — Encounter: Payer: Self-pay | Admitting: Family Medicine

## 2023-12-21 VITALS — BP 137/75 | HR 62 | Ht 71.0 in | Wt 212.0 lb

## 2023-12-21 DIAGNOSIS — R221 Localized swelling, mass and lump, neck: Secondary | ICD-10-CM

## 2023-12-21 NOTE — Progress Notes (Signed)
 BP 137/75   Pulse 62   Ht 5\' 11"  (1.803 m)   Wt 212 lb (96.2 kg)   SpO2 96%   BMI 29.57 kg/m    Subjective:   Patient ID: Keith Zimmerman, male    DOB: [birth year], 63 y.o.   MRN: [id number]  HPI: Keith Zimmerman is a 63 y.o. male presenting on 12/21/2023 for Neck Pain, Shortness of Breath, and Hypertension   HPI Neck mass Patient still has a neck masses palpable on the left side of his neck and he feels like it is enlarged a little bit.  He feels like when he is laying in certain areas that he feels like he has trouble catching his breath for minute because of the neck mass and pressure.  He has been taking anti-inflammatories and finished a course of antibiotics and prednisone and has not felt that this neck mass is changed at all.  We did a CT scan which showed no abnormalities.  He does say that he has had a recent dental pain on his right upper jaw but that just started 2 days ago.  Relevant past medical, surgical, family and social history reviewed and updated as indicated. Interim medical history since our last visit reviewed. Allergies and medications reviewed and updated.  Review of Systems  Constitutional:  Negative for chills and fever.  Eyes:  Negative for visual disturbance.  Respiratory:  Negative for shortness of breath and wheezing.   Cardiovascular:  Negative for chest pain and leg swelling.  Musculoskeletal:  Positive for myalgias and neck pain. Negative for back pain, gait problem and neck stiffness.  Skin:  Negative for rash.  All other systems reviewed and are negative.   Per HPI unless specifically indicated above   Allergies as of 12/21/2023       Reactions   Rhododendron Other (See Comments)   "blistering"        Medication List        Accurate as of December 21, 2023  9:18 AM. If you have any questions, ask your nurse or doctor.          acetaminophen 650 MG CR tablet Commonly known as: TYLENOL Take 650 mg by mouth every 8 (eight) hours as  needed for pain.   aspirin EC 325 MG tablet Take 325 mg by mouth daily.   atorvastatin 20 MG tablet Commonly known as: LIPITOR Take 1 tablet (20 mg total) by mouth daily.   CINNAMON PO Take 1,000 mg by mouth in the morning, at noon, in the evening, and at bedtime.   Contour Next One Devi Use to check blood sugars as directed dx E11.9   losartan 50 MG tablet Commonly known as: COZAAR Take 1 tablet (50 mg total) by mouth daily.   meloxicam 15 MG tablet Commonly known as: MOBIC Take 1 tablet (15 mg total) by mouth daily.         Objective:   BP 137/75   Pulse 62   Ht 5\' 11"  (1.803 m)   Wt 212 lb (96.2 kg)   SpO2 96%   BMI 29.57 kg/m   Wt Readings from Last 3 Encounters:  12/21/23 212 lb (96.2 kg)  12/02/23 215 lb 6.4 oz (97.7 kg)  11/18/23 216 lb 9.6 oz (98.2 kg)    Physical Exam Vitals and nursing note reviewed.  Constitutional:      General: He is not in acute distress.    Appearance: He is well-developed. He is not diaphoretic.  Eyes:  General: No scleral icterus.       Right eye: No discharge.     Conjunctiva/sclera: Conjunctivae normal.     Pupils: Pupils are equal, round, and reactive to light.  Neck:     Thyroid: No thyromegaly.   Cardiovascular:     Rate and Rhythm: Normal rate and regular rhythm.     Heart sounds: Normal heart sounds. No murmur heard. Pulmonary:     Effort: Pulmonary effort is normal. No respiratory distress.     Breath sounds: Normal breath sounds. No wheezing.  Musculoskeletal:     Cervical back: Neck supple. No rigidity or crepitus. No pain with movement, spinous process tenderness or muscular tenderness. Normal range of motion.  Lymphadenopathy:     Cervical: No cervical adenopathy.  Skin:    General: Skin is warm and dry.     Findings: No rash.  Neurological:     Mental Status: He is alert and oriented to person, place, and time.     Coordination: Coordination normal.  Psychiatric:        Behavior: Behavior normal.        Assessment & Plan:   Problem List Items Addressed This Visit   None Visit Diagnoses       Neck swelling    -  Primary   Relevant Orders   Ambulatory referral to ENT       Patient's CT scan did not show lymphadenopathy but there is clearly still visible and palpable nonpulsatile neck mass on the left side of his neck that is tender and is not improved.  Has finished courses of both azithromycin and prednisone and is taking an anti-inflammatory as well without any improvement.  Will do ENT referral Follow up plan: Return if symptoms worsen or fail to improve.  Counseling provided for all of the vaccine components Orders Placed This Encounter  Procedures   Ambulatory referral to ENT    Arville Care, MD [facility] 12/21/2023, 9:18 AM

## 2023-12-30 ENCOUNTER — Other Ambulatory Visit: Payer: Self-pay | Admitting: Family Medicine

## 2024-01-18 ENCOUNTER — Encounter: Payer: Self-pay | Admitting: Family Medicine

## 2024-01-18 ENCOUNTER — Ambulatory Visit (INDEPENDENT_AMBULATORY_CARE_PROVIDER_SITE_OTHER): Payer: BC Managed Care – PPO | Admitting: Family Medicine

## 2024-01-18 ENCOUNTER — Ambulatory Visit (HOSPITAL_COMMUNITY)
Admission: RE | Admit: 2024-01-18 | Discharge: 2024-01-18 | Disposition: A | Discharge status: Home / Self Care | Source: Ambulatory Visit | Attending: Family Medicine | Admitting: Family Medicine

## 2024-01-18 VITALS — BP 123/66 | HR 63 | Ht 71.0 in | Wt 216.0 lb

## 2024-01-18 DIAGNOSIS — E1169 Type 2 diabetes mellitus with other specified complication: Secondary | ICD-10-CM

## 2024-01-18 DIAGNOSIS — R590 Localized enlarged lymph nodes: Secondary | ICD-10-CM

## 2024-01-18 DIAGNOSIS — I10 Essential (primary) hypertension: Secondary | ICD-10-CM

## 2024-01-18 DIAGNOSIS — E782 Mixed hyperlipidemia: Secondary | ICD-10-CM

## 2024-01-18 LAB — BAYER DCA HB A1C WAIVED: HB A1C (BAYER DCA - WAIVED): 6.7 % — ABNORMAL HIGH (ref 4.8–5.6)

## 2024-01-18 MED ORDER — ATORVASTATIN CALCIUM 20 MG PO TABS: 20.0000 mg | ORAL_TABLET | Freq: Every day | ORAL | 3 refills | Status: AC

## 2024-01-18 MED ORDER — MELOXICAM 15 MG PO TABS: 15.0000 mg | ORAL_TABLET | Freq: Every day | ORAL | 3 refills | Status: DC

## 2024-01-18 MED ORDER — LOSARTAN POTASSIUM 50 MG PO TABS: 50.0000 mg | ORAL_TABLET | Freq: Every day | ORAL | 3 refills | Status: DC

## 2024-01-18 NOTE — Progress Notes (Signed)
 BP 123/66   Pulse 63   Ht 5\' 11"  (1.803 m)   Wt 216 lb (98 kg)   SpO2 96%   BMI 30.13 kg/m    Subjective:   Patient ID: Keith Zimmerman, male    DOB: April 10, 1961, 63 y.o.   MRN: [id number]  HPI: Keith Zimmerman is a 63 y.o. male presenting on 01/18/2024 for Medical Management of Chronic Issues, Diabetes, and nodule (Neck- has referral to ENT)   HPI Type 2 diabetes mellitus Patient comes in today for recheck of his diabetes. Patient has been currently taking no medicine, diet control. Patient is currently on an ACE inhibitor/ARB. Patient has not seen an ophthalmologist this year. Patient denies any new issues with their feet. The symptom started onset as an adult hyperlipidemia and hypertension ARE RELATED TO DM   Hyperlipidemia Patient is coming in for recheck of his hyperlipidemia. The patient is currently taking atorvastatin . They deny any issues with myalgias or history of liver damage from it. They deny any focal numbness or weakness or chest pain.   Hypertension Patient is currently on losartan , and their blood pressure today is 123/66. Patient denies any lightheadedness or dizziness. Patient denies headaches, blurred vision, chest pains, shortness of breath, or weakness. Denies any side effects from medication and is content with current medication.   Swollen lymph nodes Patient has still been getting swollen lymph nodes in his neck, they will come and go and swell up at different times then go down.  He was treated initially with an antibiotic and some steroids and was also given a CT scan.  He was found to have some dental infections that he is fighting off and on.  He does have an ENT appointment later this month.  Relevant past medical, surgical, family and social history reviewed and updated as indicated. Interim medical history since our last visit reviewed. Allergies and medications reviewed and updated.  Review of Systems  Constitutional:  Negative for chills and fever.   Eyes:  Negative for discharge.  Respiratory:  Negative for shortness of breath and wheezing.   Cardiovascular:  Negative for chest pain and leg swelling.  Musculoskeletal:  Negative for back pain and gait problem.  Skin:  Negative for rash.  All other systems reviewed and are negative.   Per HPI unless specifically indicated above   Allergies as of 01/18/2024       Reactions   Rhododendron Other (See Comments)   "blistering"        Medication List        Accurate as of Jan 18, 2024 10:11 AM. If you have any questions, ask your nurse or doctor.          STOP taking these medications    aspirin  EC 325 MG tablet Stopped by: Lucio Sabin Cadyn Rodger   CINNAMON PO Stopped by: Lucio Sabin Renesme Kerrigan       TAKE these medications    acetaminophen 650 MG CR tablet Commonly known as: TYLENOL Take 650 mg by mouth every 8 (eight) hours as needed for pain.   atorvastatin  20 MG tablet Commonly known as: LIPITOR Take 1 tablet (20 mg total) by mouth daily.   Contour Next One Devi Use to check blood sugars as directed dx E11.9   losartan  50 MG tablet Commonly known as: COZAAR  Take 1 tablet (50 mg total) by mouth daily.   meloxicam  15 MG tablet Commonly known as: MOBIC  Take 1 tablet (15 mg total) by mouth daily.  Objective:   BP 123/66   Pulse 63   Ht 5\' 11"  (1.803 m)   Wt 216 lb (98 kg)   SpO2 96%   BMI 30.13 kg/m   Wt Readings from Last 3 Encounters:  01/18/24 216 lb (98 kg)  12/21/23 212 lb (96.2 kg)  12/02/23 215 lb 6.4 oz (97.7 kg)    Physical Exam Vitals and nursing note reviewed.  Constitutional:      General: He is not in acute distress.    Appearance: He is well-developed. He is not diaphoretic.  Eyes:     General: No scleral icterus.    Conjunctiva/sclera: Conjunctivae normal.  Neck:     Thyroid : No thyromegaly.  Cardiovascular:     Rate and Rhythm: Normal rate and regular rhythm.     Heart sounds: Normal heart sounds. No murmur  heard. Pulmonary:     Effort: Pulmonary effort is normal. No respiratory distress.     Breath sounds: Normal breath sounds. No wheezing.  Musculoskeletal:        General: Normal range of motion.     Cervical back: Neck supple.  Lymphadenopathy:     Cervical: Cervical adenopathy (Palpable mobile anterior cervical lymph nodes in the upper neck, 1-1/2 cm in diameter on both sides, slightly larger on the left) present.  Skin:    General: Skin is warm and dry.     Findings: No rash.  Neurological:     Mental Status: He is alert and oriented to person, place, and time.     Coordination: Coordination normal.  Psychiatric:        Behavior: Behavior normal.       Assessment & Plan:   Problem List Items Addressed This Visit       Cardiovascular and Mediastinum   Hypertension, essential   Relevant Medications   atorvastatin  (LIPITOR) 20 MG tablet   losartan  (COZAAR ) 50 MG tablet     Endocrine   Diabetes mellitus (HCC) - Primary   Relevant Medications   atorvastatin  (LIPITOR) 20 MG tablet   losartan  (COZAAR ) 50 MG tablet   Other Relevant Orders   Bayer DCA Hb A1c Waived     Other   Mixed dyslipidemia   Relevant Medications   atorvastatin  (LIPITOR) 20 MG tablet   Other Visit Diagnoses       Anterior cervical lymphadenopathy       Relevant Orders   US  Soft Tissue Head/Neck (NON-THYROID )       A1c 6.7, looks good, blood pressure and everything else looks good as well.  Lymphadenopathy still present on exam, it has been coming and going and not fully improving, will order an ultrasound stat. Follow up plan: Return in about 3 months (around 04/19/2024), or if symptoms worsen or fail to improve, for Diabetes recheck.  Counseling provided for all of the vaccine components Orders Placed This Encounter  Procedures   US  Soft Tissue Head/Neck (NON-THYROID )   Bayer DCA Hb A1c Waived    Jolyne Needs, MD [facility] 01/18/2024, 10:11 AM

## 2024-02-10 ENCOUNTER — Ambulatory Visit (INDEPENDENT_AMBULATORY_CARE_PROVIDER_SITE_OTHER): Admitting: Otolaryngology

## 2024-02-10 ENCOUNTER — Encounter (INDEPENDENT_AMBULATORY_CARE_PROVIDER_SITE_OTHER): Payer: Self-pay | Admitting: Otolaryngology

## 2024-02-10 VITALS — BP 151/76 | HR 67 | Ht 71.0 in | Wt 213.0 lb

## 2024-02-10 DIAGNOSIS — K047 Periapical abscess without sinus: Secondary | ICD-10-CM | POA: Diagnosis not present

## 2024-02-10 DIAGNOSIS — R221 Localized swelling, mass and lump, neck: Secondary | ICD-10-CM | POA: Diagnosis not present

## 2024-02-10 NOTE — Progress Notes (Signed)
 Dear Dr. Steen Eden, Here is my assessment for our mutual patient, Keith Zimmerman. Thank you for allowing me the opportunity to care for your patient. Please do not hesitate to contact me should you have any other questions. Sincerely, Dr. Milon Aloe  Otolaryngology Clinic Note Referring provider: Dr. Steen Eden HPI:  Keith Zimmerman is a 63 y.o. male kindly referred by Dr. Steen Eden for evaluation of neck mass/lymphadenopathy  Initial visit (01/2024): Patient reports that he noted bilateral neck masses about 3 months ago, incidentally, when he was having some discomfort. He reports they have not grown in size or been persistently tender but has been quite concerned about them. Intermittent discomfort. Has been evaluated multiple times for this and was prescribed antibiotics, maybe helped some. He has also had w/u for this including US  and CT. No B symptoms.   He also reports that for last few weeks, his right maxillary molar was hurting and some purulent drainage was coming out but has not in a week or so  Patient otherwise denies: - dysphagia, odynophagia, unintentional weight loss - changes in voice, shortness of breath, hemoptysis - ear pain, other neck masses, skin cancer  He does have some other symptoms over past couple of months including SOB with exertion, fatigue, and some labile BP when he has been checking at home  Personal or FHx of bleeding dz or anesthesia difficulty: no  GLP-1: no AP/AC: no  Tobacco: 0.5 PPD for many years, quit.   PMHx: HTN, DM  Independent Review of Additional Tests or Records:  Multiple notes reviewed: Mary-Margaret Gaylyn Keas (11/02/2023): glands in neck swollen, couple of weeks; pressure in neck; Dx: LAD; Rx: Z0pak, pred Evalyn Hillier (12/02/2023): noted left neck swelling, ear pressure, no difficulty swallowing; Dx: Neck swelling, Rx: CT, CBC Dr. Steen Eden (01/18/2024): Noted swollen lymph nodes, come and go; some dental infections;  Dx: LAD, Rx: ref to  ENT CBC and CMP 12/02/2023: WBC 8.2, Hgb 14.9, Plt 184; BUN/Cr 29/0.98 US  Neck 01/18/2024: independently interpreted: scattered small LAD in b/l submandibular region CT Neck 12/02/2023 independently interpreted:no SMG stones or stranding within in neck. Scattered perifacial and b/l submandibular lymph nodes, do not appear pathologic PMH/Meds/All/SocHx/FamHx/ROS:   Past Medical History:  Diagnosis Date   Diabetes (HCC)    Hyperlipidemia    Hypertension      Past Surgical History:  Procedure Laterality Date   HAND SURGERY Right 1998    Family History  Problem Relation Age of Onset   COPD Father    Colon cancer Neg Hx    Colon polyps Neg Hx    Esophageal cancer Neg Hx    Rectal cancer Neg Hx    Stomach cancer Neg Hx      Social Connections: Socially Isolated (10/05/2023)   Social Connection and Isolation Panel [NHANES]    Frequency of Communication with Friends and Family: Once a week    Frequency of Social Gatherings with Friends and Family: Once a week    Attends Religious Services: Never    Database administrator or Organizations: No    Attends Engineer, structural: Not on file    Marital Status: Married      Current Outpatient Medications:    acetaminophen (TYLENOL) 650 MG CR tablet, Take 650 mg by mouth every 8 (eight) hours as needed for pain., Disp: , Rfl:    atorvastatin  (LIPITOR) 20 MG tablet, Take 1 tablet (20 mg total) by mouth daily., Disp: 90 tablet, Rfl: 3   Blood Glucose Monitoring Suppl (  CONTOUR NEXT ONE) DEVI, Use to check blood sugars as directed dx E11.9, Disp: 1 each, Rfl: 0   losartan  (COZAAR ) 50 MG tablet, Take 1 tablet (50 mg total) by mouth daily., Disp: 90 tablet, Rfl: 3   meloxicam  (MOBIC ) 15 MG tablet, Take 1 tablet (15 mg total) by mouth daily., Disp: 90 tablet, Rfl: 3   Physical Exam:   BP (!) 151/76 (BP Location: Right Arm, Patient Position: Sitting, Cuff Size: Large)   Pulse 67   Ht 5\' 11"  (1.803 m)   Wt 213 lb (96.6 kg)   SpO2 93%    BMI 29.71 kg/m   Salient findings:  CN II-XII intact Bilateral EAC clear and TM intact with well pneumatized middle ear spaces Anterior rhinoscopy: Septum intact; bilateral inferior turbinates without significant hypertrophy No lesions of oral cavity/oropharynx; dentition with poor and chipped/carious right max molar No obviously palpable neck masses/lymphadenopathy/thyromegaly --- the masses he points to appear to be bilateral ptotic submandibular glands, not tender No respiratory distress or stridor; TFL was indicated to better evaluate the proximal airway, given the patient's history and exam findings, and is detailed below.  Seprately Identifiable Procedures:  Prior to initiating any procedures, risks/benefits/alternatives were explained to the patient and verbal consent obtained. Procedure Note Pre-procedure diagnosis:  Neck mass, rule out proximal airway lesion Post-procedure diagnosis: Same Procedure: Transnasal Fiberoptic Laryngoscopy, CPT 31575 - Mod 25 Indication: see above Complications: None apparent EBL: 0 mL  The procedure was undertaken to further evaluate the patient's complaint above, with mirror exam inadequate for appropriate examination due to gag reflex and poor patient tolerance  Procedure:  Patient was identified as correct patient. Verbal consent was obtained. The nose was sprayed with oxymetazoline and 4% lidocaine. The The flexible laryngoscope was passed through the nose to view the nasal cavity, pharynx (oropharynx, hypopharynx) and larynx.  The larynx was examined at rest and during multiple phonatory tasks. Documentation was obtained and reviewed with patient. The scope was removed. The patient tolerated the procedure well.  Findings: The nasal cavity and nasopharynx did not reveal any masses or lesions, mucosa appeared to be without obvious lesions. The tongue base, pharyngeal walls, piriform sinuses, vallecula, epiglottis and postcricoid region are normal in  appearance. The visualized portion of the subglottis and proximal trachea is widely patent. The vocal folds are mobile bilaterally. There are no lesions on the free edge of the vocal folds nor elsewhere in the larynx worrisome for malignancy.    Electronically signed by: Evelina Hippo, MD 02/10/2024 11:21 AM   Impression & Plans:  Xabi Wittler is a 63 y.o. male with:  1. Neck mass   2. Chronic dental infection    No worrisome H&N symptoms per history above with masses he points to appearing to be ptotic submandibular glands. TFL reassuring and US  and CT without masses. Suspect may have had mild sialdenitis perhaps.  Would recommend observation for this. He was invited to call if there is any growth or worrisome sx develop  Right maxillary tooth infection: would recommend extraction; no abscess on exam today or tenderness - f/u with Dentist - advised to make a f/u Bad tooth right max  Otherwise reassuring including TFL   See below regarding exact medications prescribed this encounter including dosages and route: No orders of the defined types were placed in this encounter.     Thank you for allowing me the opportunity to care for your patient. Please do not hesitate to contact me should you have any  other questions.  Sincerely, Milon Aloe, MD Otolaryngologist (ENT), [facility] Phone: [phone] Fax: [phone]  02/10/2024, 11:21 AM   MDM:  Level 4 Complexity/Problems addressed: mod - multiple now chronic issues Data complexity: mod - independent review of notes, labs, interpretation of CT and US  imaging - Morbidity: low currently - Prescription Drug prescribed or managed: no

## 2024-02-25 ENCOUNTER — Other Ambulatory Visit: Payer: Self-pay | Admitting: Family Medicine

## 2024-02-25 DIAGNOSIS — Z9189 Other specified personal risk factors, not elsewhere classified: Secondary | ICD-10-CM

## 2024-02-25 NOTE — Progress Notes (Signed)
 The 10-year ASCVD risk score (Arnett DK, et al., 2019) is: 30.6%   Values used to calculate the score:     Age: 63 years     Clincally relevant sex: Male     Is Non-Hispanic African American: No     Diabetic: Yes     Tobacco smoker: No     Systolic Blood Pressure: 151 mmHg     Is BP treated: Yes     HDL Cholesterol: 36 mg/dL     Total Cholesterol: 192 mg/dL

## 2024-04-04 ENCOUNTER — Telehealth: Payer: Self-pay | Admitting: Family Medicine

## 2024-04-04 NOTE — Telephone Encounter (Unsigned)
 Copied from CRM [id number]. Topic: Referral - Request for Referral >> Apr 04, 2024  3:52 PM Larissa RAMAN wrote: Did the patient discuss referral with their provider in the last year? Yes (If No - schedule appointment) (If Yes - send message)  Appointment offered? No  Type of order/referral and detailed reason for visit: Cardiology   Preference of office, provider, location: Ladona PARAS., MD  If referral order, have you been seen by this specialty before? No (If Yes, this issue or another issue? When? Where?  Can we respond through MyChart? Yes

## 2024-04-05 NOTE — Telephone Encounter (Signed)
 Patient aware.

## 2024-04-05 NOTE — Telephone Encounter (Signed)
 This Referral has already been placed and sent to Dr. Aileen Office at this time.

## 2024-07-07 ENCOUNTER — Ambulatory Visit: Attending: Internal Medicine | Admitting: Cardiology

## 2024-07-07 ENCOUNTER — Encounter: Payer: Self-pay | Admitting: Cardiology

## 2024-07-07 ENCOUNTER — Other Ambulatory Visit: Payer: Self-pay

## 2024-07-07 ENCOUNTER — Other Ambulatory Visit (HOSPITAL_COMMUNITY): Payer: Self-pay

## 2024-07-07 VITALS — BP 170/90 | HR 64 | Resp 16 | Ht 71.0 in | Wt 223.0 lb

## 2024-07-07 DIAGNOSIS — I10 Essential (primary) hypertension: Secondary | ICD-10-CM

## 2024-07-07 DIAGNOSIS — E782 Mixed hyperlipidemia: Secondary | ICD-10-CM

## 2024-07-07 DIAGNOSIS — Z9189 Other specified personal risk factors, not elsewhere classified: Secondary | ICD-10-CM

## 2024-07-07 DIAGNOSIS — R221 Localized swelling, mass and lump, neck: Secondary | ICD-10-CM

## 2024-07-07 DIAGNOSIS — E119 Type 2 diabetes mellitus without complications: Secondary | ICD-10-CM

## 2024-07-07 MED ORDER — EMPAGLIFLOZIN 10 MG PO TABS
10.0000 mg | ORAL_TABLET | Freq: Every day | ORAL | 2 refills | Status: DC
  Filled 2024-07-07: qty 30, 30d supply, fill #0

## 2024-07-07 MED ORDER — AMLODIPINE BESYLATE 5 MG PO TABS
5.0000 mg | ORAL_TABLET | Freq: Every day | ORAL | 3 refills | Status: DC
  Filled 2024-07-07: qty 90, 90d supply, fill #0

## 2024-07-07 MED ORDER — TRIAMTERENE-HCTZ 37.5-25 MG PO TABS
0.5000 | ORAL_TABLET | ORAL | 2 refills | Status: DC
  Filled 2024-07-07: qty 30, 60d supply, fill #0

## 2024-07-07 NOTE — Progress Notes (Unsigned)
 Cardiology Office Note:  .   Date:  07/08/2024  ID:  Keith Zimmerman, DOB July 29, 1961, MRN [id number] PCP: Lavell Bari LABOR, FNP  [facility] Providers Cardiologist:  None   History of Present Illness: .   Keith Zimmerman is a 63 y.o. male with hypertension and diabetes who presents with throat swelling and high blood pressure. He was referred by his primary care doctor for evaluation of his heart and throat symptoms and management of hypertension which has become high and uncontrolled over the last few months.  States he was on Farxiga  with about a 25-30 Lbs weight loss and excellent BP control. Due to cost stopped using this and has gained the weight back.  No CP, SOB, PND or orthopnea, denies symptoms of OSA but has never been tested. Works in a [employer] and is very active.   Cardiac Studies relevent.        Discussed the use of AI scribe software for clinical note transcription with the patient, who gave verbal consent to proceed.  History of Present Illness Keith Zimmerman is a 63 year old male with hypertension and diabetes who presents with throat swelling and high blood pressure. He was referred by his primary care doctor for evaluation of his heart and throat symptoms.  He experiences a sensation of throat swelling for almost a year, especially when talking, but has no weakness or issues while eating. A CT scan, ultrasound, and ENT evaluation, including a nasal endoscopy, did not identify a definitive cause. Swollen salivary glands were noted, and treatments including a Z-Pak and an anti-inflammatory shot have been ineffective.  His blood pressure has been elevated for almost a year, with readings typically in the 140s to 150s, and is higher during medical visits. He was on losartan  but stopped it two days ago due to ear ringing, which he associates with a double dose. His blood pressure was well-controlled with weight loss on Farxiga , but he discontinued it due  to cost.  He manages diabetes with an A1c reduced from 12.7% to 6.6% using metformin  and Farxiga , allowing him to stop metformin . He lost 25 pounds on Farxiga  but regained some weight due to stress. Blood sugar is typically below 100 mg/dL in the evenings.  He is currently taking atorvastatin  for cholesterol, which was increased six months ago, without experiencing swelling or significant side effects.  Labs   Lab Results  Component Value Date   CHOL 192 07/01/2023   HDL 36 (L) 07/01/2023   LDLCALC 102 (H) 07/01/2023   TRIG 319 (H) 07/01/2023   CHOLHDL 5.3 (H) 07/01/2023   No results found for: LIPOA  Recent Labs    12/02/23 0820 12/02/23 1115  NA 140  --   K 4.1  --   CL 105  --   CO2 19*  --   GLUCOSE 112*  --   BUN 29*  --   CREATININE 0.98 1.00  CALCIUM  9.5  --     Lab Results  Component Value Date   ALT 40 12/02/2023   AST 24 12/02/2023   ALKPHOS 98 12/02/2023   BILITOT 1.0 12/02/2023      Latest Ref Rng & Units 12/02/2023    8:20 AM 11/18/2023    4:20 PM 07/01/2023    9:27 AM  CBC  WBC 3.4 - 10.8 x10E3/uL 8.2  7.7  6.3   Hemoglobin 13.0 - 17.7 g/dL 85.0  85.4  84.8   Hematocrit 37.5 - 51.0 % 44.7  42.8  45.8   Platelets 150 - 450 x10E3/uL 184  179  159    Lab Results  Component Value Date   HGBA1C 6.7 (H) 01/18/2024    Lab Results  Component Value Date   TSH 2.250 06/13/2013     ROS  Review of Systems  Cardiovascular:  Negative for chest pain, dyspnea on exertion and leg swelling.   Physical Exam:   VS:  BP (!) 170/90 (BP Location: Left Arm, Patient Position: Sitting, Cuff Size: Large)   Pulse 64   Resp 16   Ht 5' 11 (1.803 m)   Wt 223 lb (101.2 kg)   SpO2 97%   BMI 31.10 kg/m    Wt Readings from Last 3 Encounters:  07/07/24 223 lb (101.2 kg)  02/10/24 213 lb (96.6 kg)  01/18/24 216 lb (98 kg)    BP Readings from Last 3 Encounters:  07/07/24 (!) 170/90  02/10/24 (!) 151/76  01/18/24 123/66   Physical Exam Constitutional:       Appearance: He is obese.  Neck:     Vascular: No carotid bruit or JVD.  Cardiovascular:     Rate and Rhythm: Normal rate and regular rhythm.     Pulses: Intact distal pulses.     Heart sounds: Normal heart sounds. No murmur heard.    No gallop.  Pulmonary:     Effort: Pulmonary effort is normal.     Breath sounds: Normal breath sounds.  Abdominal:     General: Bowel sounds are normal.     Palpations: Abdomen is soft.  Musculoskeletal:     Right lower leg: No edema.     Left lower leg: No edema.    EKG:    EKG Interpretation Date/Time:  Thursday July 07 2024 09:45:31 EDT Ventricular Rate:  62 PR Interval:  142 QRS Duration:  100 QT Interval:  408 QTC Calculation: 414 R Axis:   -6  Text Interpretation: EKG 07/07/2024: Normal sinus rhythm at rate of 62 bpm, incomplete right bundle branch block.  Compared to 12/24/2017, no change.  Normal EKG. Confirmed by Kamsiyochukwu Spickler, Jagadeesh [phone]) on 07/07/2024 10:06:37 AM    ASSESSMENT AND PLAN: .      ICD-10-CM   1. Hypertension, essential  I10 EKG 12-Lead    amLODipine (NORVASC) 5 MG tablet    triamterene-hydrochlorothiazide (MAXZIDE-25) 37.5-25 MG tablet    Basic metabolic panel with GFR    Lipid panel    2. Mixed dyslipidemia  E78.2 Basic metabolic panel with GFR    Lipid panel    3. Type 2 diabetes mellitus without complication, without long-term current use of insulin (HCC)  E11.9 empagliflozin (JARDIANCE) 10 MG TABS tablet    Basic metabolic panel with GFR    Lipid panel    4. Cardiovascular risk factor  Z91.89 Basic metabolic panel with GFR    Lipid panel    5. Throat swelling  R22.1      Assessment & Plan Essential hypertension Hypertension has been elevated for almost a year, with readings around 140-150 mmHg. Previous treatment with losartan  was discontinued today due to an allergic reaction, manifesting as throat swelling and ear ringing. Will see if symptoms of throat swelling will improve with this. If symptoms  persist, then we could restart Losartan  in view of DM. Karendia may be a choice as well. Blood pressure control is crucial to reduce cardiovascular risk. - Prescribe amlodipine 5 mg once daily. - Prescribe Maxidex 37.5/25 mg, instruct to cut in half and take  half in the morning. If blood pressure remains above 130/80 mmHg, increase to a whole pill. - Order blood work in two weeks to assess kidney function and cholesterol levels.  Mixed hyperlipidemia Mixed hyperlipidemia managed with atorvastatin . Dose was increased six months ago. Monitoring of cholesterol levels is necessary to assess efficacy of current treatment. - Order blood work in two weeks to assess cholesterol levels.  Type 2 diabetes mellitus Type 2 diabetes previously managed with metformin  and Farxiga . A1c improved from 12.7% to 6.6%. Farxiga  was discontinued due to cost. Blood sugar levels are well-controlled with current lifestyle modifications. - Prescribe Jardiance as an alternative to Farxiga , pending cost assessment by the pharmacy.  Allergic reaction to losartan  Allergic reaction to losartan  manifested as throat swelling and ear ringing. Symptoms improved after discontinuation of losartan . - Avoid losartan  in the future.  Suspected sleep apnea Suspected sleep apnea due to daytime fatigue and potential snoring. - Consider discussing sleep patterns with his wife to assess for snoring or other symptoms of sleep apnea.   Follow up: 6 weeks for HTN, Hyperlipidemia and DM  Signed,  Gordy Bergamo, MD, [facility] 07/08/2024, 6:26 AM [facility] [address] [place], [place] [address] Phone: [phone]. Fax:  [phone]

## 2024-07-07 NOTE — Patient Instructions (Addendum)
 Medication Instructions:  Your physician has recommended you make the following change in your medication:   ** Stop Losartan   ** Begin Amlodipine 5mg  - 1 tablet by mouth daily  ** Begin Triamterene -Hydrochlorothiazide 37.5-25 - 1/2 tablet by mouth daily  ** Begin Jardiance 10mg  - 1 tablet by mouth daily.  *If you need a refill on your cardiac medications before your next appointment, please call your pharmacy*  Lab Work: Lipid Panel and BMET in 2 weeks If you have labs (blood work) drawn today and your tests are completely normal, you will receive your results only by: MyChart Message (if you have MyChart) OR A paper copy in the mail If you have any lab test that is abnormal or we need to change your treatment, we will call you to review the results.  Testing/Procedures: None ordered.   Follow-Up: At [facility], you and your health needs are our priority.  As part of our continuing mission to provide you with exceptional heart care, our providers are all part of one team.  This team includes your primary Cardiologist (physician) and Advanced Practice Providers or APPs (Physician Assistants and Nurse Practitioners) who all work together to provide you with the care you need, when you need it.  Your next appointment:   6 weeks with Dr Ganji

## 2024-07-07 NOTE — Progress Notes (Signed)
 Keith Zimmerman                                          MRN: [id number]   07/07/2024   The [facility] Quality Team Specialist reviewed this patient medical record for the purposes of chart review for care gap closure. The following were reviewed: chart review for care gap closure-controlling blood pressure.    [facility] Quality Team

## 2024-07-11 ENCOUNTER — Encounter: Payer: Self-pay | Admitting: Family

## 2024-07-11 ENCOUNTER — Ambulatory Visit: Admitting: Family

## 2024-07-11 ENCOUNTER — Other Ambulatory Visit

## 2024-07-11 VITALS — BP 155/72 | HR 71 | Temp 96.4°F | Ht 71.0 in | Wt 215.6 lb

## 2024-07-11 DIAGNOSIS — I10 Essential (primary) hypertension: Secondary | ICD-10-CM

## 2024-07-11 DIAGNOSIS — Z114 Encounter for screening for human immunodeficiency virus [HIV]: Secondary | ICD-10-CM

## 2024-07-11 DIAGNOSIS — E782 Mixed hyperlipidemia: Secondary | ICD-10-CM

## 2024-07-11 DIAGNOSIS — E1169 Type 2 diabetes mellitus with other specified complication: Secondary | ICD-10-CM

## 2024-07-11 DIAGNOSIS — Z1211 Encounter for screening for malignant neoplasm of colon: Secondary | ICD-10-CM

## 2024-07-11 DIAGNOSIS — Z122 Encounter for screening for malignant neoplasm of respiratory organs: Secondary | ICD-10-CM

## 2024-07-11 DIAGNOSIS — Z0000 Encounter for general adult medical examination without abnormal findings: Secondary | ICD-10-CM

## 2024-07-11 DIAGNOSIS — Z0001 Encounter for general adult medical examination with abnormal findings: Secondary | ICD-10-CM

## 2024-07-11 DIAGNOSIS — E669 Obesity, unspecified: Secondary | ICD-10-CM | POA: Diagnosis not present

## 2024-07-11 DIAGNOSIS — K429 Umbilical hernia without obstruction or gangrene: Secondary | ICD-10-CM | POA: Insufficient documentation

## 2024-07-11 DIAGNOSIS — Z87891 Personal history of nicotine dependence: Secondary | ICD-10-CM

## 2024-07-11 LAB — LIPID PANEL

## 2024-07-11 LAB — BAYER DCA HB A1C WAIVED: HB A1C (BAYER DCA - WAIVED): 7 % — ABNORMAL HIGH (ref 4.8–5.6)

## 2024-07-11 MED ORDER — TRIAMTERENE-HCTZ 37.5-25 MG PO TABS
1.0000 | ORAL_TABLET | ORAL | 1 refills | Status: DC
Start: 2024-07-11 — End: 2024-07-25

## 2024-07-11 NOTE — Progress Notes (Signed)
 Subjective:    Patient ID: Keith Zimmerman, male    DOB: 1961-02-11, 63 y.o.   MRN: [id number]  Chief Complaint  Patient presents with   Annual Exam   PT presents to the office today to establish for CPE.   He is followed by Cardiologists for hyperlipidemia, HTN, and cardiovascular risk.  He was have angioedema, but stopped his losartan . Since stopping this his throat has stopped swelling.   He is a former smoker and quit smoking 2022. He smoked 43 years.   Complaining of umbilical hernia for the last two years.  Hypertension This is a chronic problem. The current episode started more than 1 year ago. The problem has been waxing and waning since onset. The problem is uncontrolled. Pertinent negatives include no blurred vision, malaise/fatigue, peripheral edema or shortness of breath. Risk factors for coronary artery disease include obesity, male gender and sedentary lifestyle. The current treatment provides moderate improvement.  Hyperlipidemia This is a chronic problem. The current episode started more than 1 year ago. The problem is uncontrolled. Exacerbating diseases include obesity. Pertinent negatives include no shortness of breath. Current antihyperlipidemic treatment includes statins. The current treatment provides moderate improvement of lipids. Risk factors for coronary artery disease include male sex, hypertension, a sedentary lifestyle, dyslipidemia and diabetes mellitus.  Diabetes He presents for his follow-up diabetic visit. He has type 2 diabetes mellitus. Pertinent negatives for diabetes include no blurred vision and no foot paresthesias. Symptoms are stable. Risk factors for coronary artery disease include dyslipidemia, diabetes mellitus, hypertension and male sex. He is following a generally healthy diet. His overall blood glucose range is 110-130 mg/dl.      Review of Systems  Constitutional:  Negative for malaise/fatigue.  Eyes:  Negative for blurred vision.   Respiratory:  Negative for shortness of breath.   All other systems reviewed and are negative.   Social History   Socioeconomic History   Marital status: Married    Spouse name: Not on file   Number of children: Not on file   Years of education: Not on file   Highest education level: 12th grade  Occupational History   Not on file  Tobacco Use   Smoking status: Former    Current packs/day: 0.50    Average packs/day: 0.5 packs/day for 31.0 years (15.5 ttl pk-yrs)    Types: Cigarettes   Smokeless tobacco: Never  Vaping Use   Vaping status: Never Used  Substance and Sexual Activity   Alcohol use: No   Drug use: No   Sexual activity: Not on file  Other Topics Concern   Not on file  Social History Narrative   Not on file   Social Drivers of Health   Financial Resource Strain: Low Risk  (10/05/2023)   Overall Financial Resource Strain (CARDIA)    Difficulty of Paying Living Expenses: Not hard at all  Food Insecurity: No Food Insecurity (10/05/2023)   Hunger Vital Sign    Worried About Running Out of Food in the Last Year: Never true    Ran Out of Food in the Last Year: Never true  Transportation Needs: No Transportation Needs (10/05/2023)   PRAPARE - Administrator, Civil Service (Medical): No    Lack of Transportation (Non-Medical): No  Physical Activity: Unknown (10/05/2023)   Exercise Vital Sign    Days of Exercise per Week: 0 days    Minutes of Exercise per Session: Not on file  Stress: No Stress Concern Present (10/05/2023)  [employer] of Occupational Health - Occupational Stress Questionnaire    Feeling of Stress : Not at all  Social Connections: Socially Isolated (10/05/2023)   Social Connection and Isolation Panel    Frequency of Communication with Friends and Family: Once a week    Frequency of Social Gatherings with Friends and Family: Once a week    Attends Religious Services: Never    Database Administrator or Organizations: No    Attends  Engineer, Structural: Not on file    Marital Status: Married   Family History  Problem Relation Age of Onset   COPD Father    Colon cancer Neg Hx    Colon polyps Neg Hx    Esophageal cancer Neg Hx    Rectal cancer Neg Hx    Stomach cancer Neg Hx         Objective:   Physical Exam Vitals reviewed.  Constitutional:      General: He is not in acute distress.    Appearance: He is well-developed.  HENT:     Head: Normocephalic.     Right Ear: Tympanic membrane normal.     Left Ear: Tympanic membrane normal.  Eyes:     General:        Right eye: No discharge.        Left eye: No discharge.     Pupils: Pupils are equal, round, and reactive to light.  Neck:     Thyroid : No thyromegaly.  Cardiovascular:     Rate and Rhythm: Normal rate and regular rhythm.     Heart sounds: Normal heart sounds. No murmur heard. Pulmonary:     Effort: Pulmonary effort is normal. No respiratory distress.     Breath sounds: Normal breath sounds. No wheezing.  Abdominal:     General: Bowel sounds are normal. There is no distension.     Palpations: Abdomen is soft.     Tenderness: There is no abdominal tenderness.     Hernia: A hernia is present. Hernia is present in the umbilical area.  Musculoskeletal:        General: No tenderness. Normal range of motion.     Cervical back: Normal range of motion and neck supple.  Skin:    General: Skin is warm and dry.     Findings: No erythema or rash.  Neurological:     Mental Status: He is alert and oriented to person, place, and time.     Cranial Nerves: No cranial nerve deficit.     Deep Tendon Reflexes: Reflexes are normal and symmetric.  Psychiatric:        Behavior: Behavior normal.        Thought Content: Thought content normal.        Judgment: Judgment normal.     Diabetic Foot Exam - Simple   Simple Foot Form Diabetic Foot exam was performed with the following findings: Yes 07/11/2024 11:19 AM  Visual Inspection No  deformities, no ulcerations, no other skin breakdown bilaterally: Yes Sensation Testing Intact to touch and monofilament testing bilaterally: Yes Pulse Check Posterior Tibialis and Dorsalis pulse intact bilaterally: Yes Comments      BP (!) 155/72   Pulse 71   Temp (!) 96.4 F (35.8 C) (Temporal)   Ht 5' 11 (1.803 m)   Wt 215 lb 9.6 oz (97.8 kg)   SpO2 95%   BMI 30.07 kg/m      Assessment & Plan:  Keith Zimmerman comes in today  with chief complaint of Annual Exam   Diagnosis and orders addressed:  1. Annual physical exam (Primary) - Bayer DCA Hb A1c Waived - CBC with Differential/Platelet - Lipid panel - CMP14+EGFR - Microalbumin / creatinine urine ratio - PSA, total and free - TSH  2. Type 2 diabetes mellitus with other specified complication, without long-term current use of insulin (HCC) - Bayer DCA Hb A1c Waived - CBC with Differential/Platelet - CMP14+EGFR - Microalbumin / creatinine urine ratio - TSH - HIV Antibody (routine testing w rflx)  3. Hypertension, essential - CBC with Differential/Platelet - CMP14+EGFR - TSH  4. Mixed dyslipidemia - CBC with Differential/Platelet - Lipid panel - CMP14+EGFR  5. Obesity (BMI 30-39.9) - CBC with Differential/Platelet - CMP14+EGFR  6. Former smoker - CBC with Differential/Platelet - CMP14+EGFR  7. Encounter for screening for lung cancer - CBC with Differential/Platelet - CMP14+EGFR - Ambulatory Referral Lung Cancer Screening [facility]  8. Colon cancer screening - CMP14+EGFR - Ambulatory referral to Gastroenterology  9. Encounter for screening for HIV - HIV Antibody (routine testing w rflx)  10. Umbilical hernia without obstruction and without gangrene - Ambulatory referral to General Surgery   Labs pending Referral to GI for colonoscopy Referral to annual lung cancer screening  Referral placed for General surgery for hernia Will also increase Maxzide 37.5-25 mg to 1 tab from 0.5   Continue current medications  Keep follow up with specialists  Health Maintenance reviewed Diet and exercise encouraged  Return in about 2 weeks (around 07/25/2024), or if symptoms worsen or fail to improve, for HTN follow up.    Bari Learn, FNP

## 2024-07-11 NOTE — Patient Instructions (Signed)
 Hypertension, Adult High blood pressure (hypertension) is when the force of blood pumping through the arteries is too strong. The arteries are the blood vessels that carry blood from the heart throughout the body. Hypertension forces the heart to work harder to pump blood and may cause arteries to become narrow or stiff. Untreated or uncontrolled hypertension can lead to a heart attack, heart failure, a stroke, kidney disease, and other problems. A blood pressure reading consists of a higher number over a lower number. Ideally, your blood pressure should be below 120/80. The first ("top") number is called the systolic pressure. It is a measure of the pressure in your arteries as your heart beats. The second ("bottom") number is called the diastolic pressure. It is a measure of the pressure in your arteries as the heart relaxes. What are the causes? The exact cause of this condition is not known. There are some conditions that result in high blood pressure. What increases the risk? Certain factors may make you more likely to develop high blood pressure. Some of these risk factors are under your control, including: Smoking. Not getting enough exercise or physical activity. Being overweight. Having too much fat, sugar, calories, or salt (sodium) in your diet. Drinking too much alcohol. Other risk factors include: Having a personal history of heart disease, diabetes, high cholesterol, or kidney disease. Stress. Having a family history of high blood pressure and high cholesterol. Having obstructive sleep apnea. Age. The risk increases with age. What are the signs or symptoms? High blood pressure may not cause symptoms. Very high blood pressure (hypertensive crisis) may cause: Headache. Fast or irregular heartbeats (palpitations). Shortness of breath. Nosebleed. Nausea and vomiting. Vision changes. Severe chest pain, dizziness, and seizures. How is this diagnosed? This condition is diagnosed by  measuring your blood pressure while you are seated, with your arm resting on a flat surface, your legs uncrossed, and your feet flat on the floor. The cuff of the blood pressure monitor will be placed directly against the skin of your upper arm at the level of your heart. Blood pressure should be measured at least twice using the same arm. Certain conditions can cause a difference in blood pressure between your right and left arms. If you have a high blood pressure reading during one visit or you have normal blood pressure with other risk factors, you may be asked to: Return on a different day to have your blood pressure checked again. Monitor your blood pressure at home for 1 week or longer. If you are diagnosed with hypertension, you may have other blood or imaging tests to help your health care provider understand your overall risk for other conditions. How is this treated? This condition is treated by making healthy lifestyle changes, such as eating healthy foods, exercising more, and reducing your alcohol intake. You may be referred for counseling on a healthy diet and physical activity. Your health care provider may prescribe medicine if lifestyle changes are not enough to get your blood pressure under control and if: Your systolic blood pressure is above 130. Your diastolic blood pressure is above 80. Your personal target blood pressure may vary depending on your medical conditions, your age, and other factors. Follow these instructions at home: Eating and drinking  Eat a diet that is high in fiber and potassium, and low in sodium, added sugar, and fat. An example of this eating plan is called the DASH diet. DASH stands for Dietary Approaches to Stop Hypertension. To eat this way: Eat  plenty of fresh fruits and vegetables. Try to fill one half of your plate at each meal with fruits and vegetables. Eat whole grains, such as whole-wheat pasta, brown rice, or whole-grain bread. Fill about one  fourth of your plate with whole grains. Eat or drink low-fat dairy products, such as skim milk or low-fat yogurt. Avoid fatty cuts of meat, processed or cured meats, and poultry with skin. Fill about one fourth of your plate with lean proteins, such as fish, chicken without skin, beans, eggs, or tofu. Avoid pre-made and processed foods. These tend to be higher in sodium, added sugar, and fat. Reduce your daily sodium intake. Many people with hypertension should eat less than 1,500 mg of sodium a day. Do not drink alcohol if: Your health care provider tells you not to drink. You are pregnant, may be pregnant, or are planning to become pregnant. If you drink alcohol: Limit how much you have to: 0-1 drink a day for women. 0-2 drinks a day for men. Know how much alcohol is in your drink. In the [place] one drink equals one 12 oz bottle of beer (355 mL), one 5 oz glass of wine (148 mL), or one 1 oz glass of hard liquor (44 mL). Lifestyle  Work with your health care provider to maintain a healthy body weight or to lose weight. Ask what an ideal weight is for you. Get at least 30 minutes of exercise that causes your heart to beat faster (aerobic exercise) most days of the week. Activities may include walking, swimming, or biking. Include exercise to strengthen your muscles (resistance exercise), such as Pilates or lifting weights, as part of your weekly exercise routine. Try to do these types of exercises for 30 minutes at least 3 days a week. Do not use any products that contain nicotine or tobacco. These products include cigarettes, chewing tobacco, and vaping devices, such as e-cigarettes. If you need help quitting, ask your health care provider. Monitor your blood pressure at home as told by your health care provider. Keep all follow-up visits. This is important. Medicines Take over-the-counter and prescription medicines only as told by your health care provider. Follow directions carefully. Blood  pressure medicines must be taken as prescribed. Do not skip doses of blood pressure medicine. Doing this puts you at risk for problems and can make the medicine less effective. Ask your health care provider about side effects or reactions to medicines that you should watch for. Contact a health care provider if you: Think you are having a reaction to a medicine you are taking. Have headaches that keep coming back (recurring). Feel dizzy. Have swelling in your ankles. Have trouble with your vision. Get help right away if you: Develop a severe headache or confusion. Have unusual weakness or numbness. Feel faint. Have severe pain in your chest or abdomen. Vomit repeatedly. Have trouble breathing. These symptoms may be an emergency. Get help right away. Call 911. Do not wait to see if the symptoms will go away. Do not drive yourself to the hospital. Summary Hypertension is when the force of blood pumping through your arteries is too strong. If this condition is not controlled, it may put you at risk for serious complications. Your personal target blood pressure may vary depending on your medical conditions, your age, and other factors. For most people, a normal blood pressure is less than 120/80. Hypertension is treated with lifestyle changes, medicines, or a combination of both. Lifestyle changes include losing weight, eating a healthy,  low-sodium diet, exercising more, and limiting alcohol. This information is not intended to replace advice given to you by your health care provider. Make sure you discuss any questions you have with your health care provider. Document Revised: 07/09/2021 Document Reviewed: 07/09/2021 Elsevier Patient Education  2024 [employer].

## 2024-07-12 ENCOUNTER — Ambulatory Visit: Payer: Self-pay | Admitting: Family

## 2024-07-12 LAB — CBC WITH DIFFERENTIAL/PLATELET
Basophils Absolute: 0.1 x10E3/uL (ref 0.0–0.2)
Basos: 1 %
EOS (ABSOLUTE): 0.1 x10E3/uL (ref 0.0–0.4)
Eos: 1 %
Hematocrit: 48 % (ref 37.5–51.0)
Hemoglobin: 15.5 g/dL (ref 13.0–17.7)
Immature Grans (Abs): 0.1 x10E3/uL (ref 0.0–0.1)
Immature Granulocytes: 1 %
Lymphocytes Absolute: 1.7 x10E3/uL (ref 0.7–3.1)
Lymphs: 20 %
MCH: 29.6 pg (ref 26.6–33.0)
MCHC: 32.3 g/dL (ref 31.5–35.7)
MCV: 92 fL (ref 79–97)
Monocytes Absolute: 0.7 x10E3/uL (ref 0.1–0.9)
Monocytes: 8 %
Neutrophils Absolute: 6 x10E3/uL (ref 1.4–7.0)
Neutrophils: 69 %
Platelets: 200 x10E3/uL (ref 150–450)
RBC: 5.23 x10E6/uL (ref 4.14–5.80)
RDW: 12.5 % (ref 11.6–15.4)
WBC: 8.6 x10E3/uL (ref 3.4–10.8)

## 2024-07-12 LAB — MICROALBUMIN / CREATININE URINE RATIO
Creatinine, Urine: 33.2 mg/dL
Microalb/Creat Ratio: 70 mg/g{creat} — ABNORMAL HIGH (ref 0–29)
Microalbumin, Urine: 23.4 ug/mL

## 2024-07-12 LAB — CMP14+EGFR
ALT: 36 IU/L (ref 0–44)
AST: 18 IU/L (ref 0–40)
Albumin: 4.6 g/dL (ref 3.9–4.9)
Alkaline Phosphatase: 121 IU/L (ref 47–123)
BUN/Creatinine Ratio: 28 — AB (ref 10–24)
BUN: 32 mg/dL — AB (ref 8–27)
Bilirubin Total: 1 mg/dL (ref 0.0–1.2)
CO2: 20 mmol/L (ref 20–29)
Calcium: 9.5 mg/dL (ref 8.6–10.2)
Chloride: 98 mmol/L (ref 96–106)
Creatinine, Ser: 1.16 mg/dL (ref 0.76–1.27)
Globulin, Total: 2.6 g/dL (ref 1.5–4.5)
Glucose: 273 mg/dL — AB (ref 70–99)
Potassium: 3.9 mmol/L (ref 3.5–5.2)
Sodium: 136 mmol/L (ref 134–144)
Total Protein: 7.2 g/dL (ref 6.0–8.5)
eGFR: 71 mL/min/1.73 (ref >59)

## 2024-07-12 LAB — LIPID PANEL
Cholesterol, Total: 228 mg/dL — AB (ref 100–199)
HDL: 34 mg/dL — AB (ref >39)
LDL CALC COMMENT:: 6.7 ratio — AB (ref 0.0–5.0)
LDL Chol Calc (NIH): 123 mg/dL — AB (ref 0–99)
Triglycerides: 400 mg/dL — AB (ref 0–149)
VLDL Cholesterol Cal: 71 mg/dL — AB (ref 5–40)

## 2024-07-12 LAB — PSA, TOTAL AND FREE
PSA, Free Pct: 26.4 %
PSA, Free: 0.37 ng/mL
Prostate Specific Ag, Serum: 1.4 ng/mL (ref 0.0–4.0)

## 2024-07-12 LAB — TSH: TSH: 2 u[IU]/mL (ref 0.450–4.500)

## 2024-07-12 LAB — HIV ANTIBODY (ROUTINE TESTING W REFLEX): HIV Screen 4th Generation wRfx: NONREACTIVE

## 2024-07-13 NOTE — Telephone Encounter (Signed)
 Copied from CRM [id number]. Topic: General - Other >> Jul 12, 2024  5:20 PM Keith Zimmerman wrote: Reason for CRM: Pt returning [name], CMA call regarding lab results which were provided. Pt wants to know if he takes the Lipitor increased to 40 mg at bed time. Please contact pt via MyChart.

## 2024-07-18 ENCOUNTER — Telehealth: Payer: Self-pay

## 2024-07-18 DIAGNOSIS — Z122 Encounter for screening for malignant neoplasm of respiratory organs: Secondary | ICD-10-CM

## 2024-07-18 DIAGNOSIS — Z87891 Personal history of nicotine dependence: Secondary | ICD-10-CM

## 2024-07-18 NOTE — Telephone Encounter (Signed)
 Lung Cancer Screening Narrative/Criteria Questionnaire (Cigarette Smokers Only- No Cigars/Pipes/vapes)   Keith Zimmerman   SDMV:07/27/2024 at 10:00 am [name]    1961/07/09               LDCT: 08/01/2024 at 10:00 am [facility]    63 y.o.   Phone: [phone]  Lung Screening Narrative (confirm age 10-77 yrs Medicare / 50-80 yrs Private pay insurance)   Insurance information:BCBS   Referring Provider: Lavell, NP   This screening involves an initial phone call with a team member from our program. It is called a shared decision making visit. The initial meeting is required by  insurance and Medicare to make sure you understand the program. This appointment takes about 15-20 minutes to complete. You will complete the screening scan at your scheduled date/time.  This scan takes about 5-10 minutes to complete. You can eat and drink normally before and after the scan.  Criteria questions for Lung Cancer Screening:   Are you a current or former smoker? Former Age began smoking: 6   If you are a former smoker, what year did you quit smoking? Quit 04/2021 (within 15 yrs)   To calculate your smoking history, I need an accurate estimate of how many packs of cigarettes you smoked per day and for how many years. (Not just the number of PPD you are now smoking)   Years smoking 40 x Packs per day .5 = Pack years 20   (at least 20 pack yrs)   (Make sure they understand that we need to know how much they have smoked in the past, not just the number of PPD they are smoking now)  Do you have a personal history of cancer?  No    Do you have a family history of cancer? No  Are you coughing up blood?  No  Have you had unexplained weight loss of 15 lbs or more in the last 6 months? No  It looks like you meet all criteria.  When would be a good time for us  to schedule you for this screening?   Additional information: N/A

## 2024-07-20 ENCOUNTER — Other Ambulatory Visit: Payer: Self-pay

## 2024-07-25 ENCOUNTER — Encounter: Payer: Self-pay | Admitting: Family

## 2024-07-25 ENCOUNTER — Ambulatory Visit (INDEPENDENT_AMBULATORY_CARE_PROVIDER_SITE_OTHER): Payer: Self-pay | Admitting: Family

## 2024-07-25 VITALS — BP 133/71 | HR 60 | Temp 97.7°F | Ht 71.0 in | Wt 214.4 lb

## 2024-07-25 DIAGNOSIS — Z7984 Long term (current) use of oral hypoglycemic drugs: Secondary | ICD-10-CM | POA: Diagnosis not present

## 2024-07-25 DIAGNOSIS — I10 Essential (primary) hypertension: Secondary | ICD-10-CM | POA: Diagnosis not present

## 2024-07-25 DIAGNOSIS — E1169 Type 2 diabetes mellitus with other specified complication: Secondary | ICD-10-CM | POA: Diagnosis not present

## 2024-07-25 LAB — BMP8+EGFR
BUN/Creatinine Ratio: 26 — ABNORMAL HIGH (ref 10–24)
BUN: 27 mg/dL (ref 8–27)
CO2: 19 mmol/L — ABNORMAL LOW (ref 20–29)
Calcium: 9.3 mg/dL (ref 8.6–10.2)
Chloride: 105 mmol/L (ref 96–106)
Creatinine, Ser: 1.03 mg/dL (ref 0.76–1.27)
Glucose: 117 mg/dL — ABNORMAL HIGH (ref 70–99)
Potassium: 4.4 mmol/L (ref 3.5–5.2)
Sodium: 141 mmol/L (ref 134–144)
eGFR: 82 mL/min/1.73 (ref >59)

## 2024-07-25 MED ORDER — EMPAGLIFLOZIN 25 MG PO TABS: 25.0000 mg | ORAL_TABLET | Freq: Every day | ORAL | 3 refills | Status: AC

## 2024-07-25 NOTE — Progress Notes (Signed)
 Subjective:    Patient ID: Keith Zimmerman, male    DOB: 10/27/1960, 63 y.o.   MRN: [id number]  Chief Complaint  Patient presents with   Diabetes    BS spiked really high Thursday. It got to 216.   PT presents to the office today to follow up on HTN and DM. He was seen on 07/11/24 and we increased his Maxzide 37.5-25 mg to 1 tab from 0.5 mg. He stopped this because of constipation. He has continued his Norvasc 5 mg.  Diabetes He presents for his follow-up diabetic visit. He has type 2 diabetes mellitus. Pertinent negatives for diabetes include no blurred vision and no foot paresthesias. Symptoms are stable. Risk factors for coronary artery disease include diabetes mellitus, hypertension, male sex, dyslipidemia and sedentary lifestyle. He is following a generally healthy diet. His overall blood glucose range is 140-180 mg/dl.  Hypertension This is a chronic problem. The current episode started more than 1 year ago. The problem has been resolved since onset. The problem is controlled. Pertinent negatives include no blurred vision, malaise/fatigue, peripheral edema or shortness of breath. Risk factors for coronary artery disease include obesity, male gender, dyslipidemia and diabetes mellitus. The current treatment provides moderate improvement.      Review of Systems  Constitutional:  Negative for malaise/fatigue.  Eyes:  Negative for blurred vision.  Respiratory:  Negative for shortness of breath.   All other systems reviewed and are negative.   Social History   Socioeconomic History   Marital status: Married    Spouse name: Not on file   Number of children: Not on file   Years of education: Not on file   Highest education level: 12th grade  Occupational History   Not on file  Tobacco Use   Smoking status: Former    Current packs/day: 0.50    Average packs/day: 0.5 packs/day for 40.0 years (20.0 ttl pk-yrs)    Types: Cigarettes   Smokeless tobacco: Never  Vaping Use    Vaping status: Never Used  Substance and Sexual Activity   Alcohol use: No   Drug use: No   Sexual activity: Not on file  Other Topics Concern   Not on file  Social History Narrative   Not on file   Social Drivers of Health   Financial Resource Strain: Low Risk  (07/24/2024)   Overall Financial Resource Strain (CARDIA)    Difficulty of Paying Living Expenses: Not hard at all  Food Insecurity: No Food Insecurity (07/24/2024)   Hunger Vital Sign    Worried About Running Out of Food in the Last Year: Never true    Ran Out of Food in the Last Year: Never true  Transportation Needs: No Transportation Needs (07/24/2024)   PRAPARE - Administrator, Civil Service (Medical): No    Lack of Transportation (Non-Medical): No  Physical Activity: Unknown (07/24/2024)   Exercise Vital Sign    Days of Exercise per Week: 5 days    Minutes of Exercise per Session: Not on file  Stress: No Stress Concern Present (07/24/2024)   [employer] of Occupational Health - Occupational Stress Questionnaire    Feeling of Stress: Not at all  Social Connections: Moderately Integrated (07/24/2024)   Social Connection and Isolation Panel    Frequency of Communication with Friends and Family: More than three times a week    Frequency of Social Gatherings with Friends and Family: Three times a week    Attends Religious Services: 1 to 4  times per year    Active Member of Clubs or Organizations: No    Attends Engineer, Structural: Not on file    Marital Status: Married   Family History  Problem Relation Age of Onset   COPD Father    Colon cancer Neg Hx    Colon polyps Neg Hx    Esophageal cancer Neg Hx    Rectal cancer Neg Hx    Stomach cancer Neg Hx         Objective:   Physical Exam Vitals reviewed.  Constitutional:      General: He is not in acute distress.    Appearance: He is well-developed.  HENT:     Head: Normocephalic.     Right Ear: Tympanic membrane normal.     Left  Ear: Tympanic membrane normal.  Eyes:     General:        Right eye: No discharge.        Left eye: No discharge.     Pupils: Pupils are equal, round, and reactive to light.  Neck:     Thyroid : No thyromegaly.  Cardiovascular:     Rate and Rhythm: Normal rate and regular rhythm.     Heart sounds: Normal heart sounds. No murmur heard. Pulmonary:     Effort: Pulmonary effort is normal. No respiratory distress.     Breath sounds: Normal breath sounds. No wheezing.  Abdominal:     General: Bowel sounds are normal. There is no distension.     Palpations: Abdomen is soft.     Tenderness: There is no abdominal tenderness.  Musculoskeletal:        General: No tenderness. Normal range of motion.     Cervical back: Normal range of motion and neck supple.  Skin:    General: Skin is warm and dry.     Findings: No erythema or rash.  Neurological:     Mental Status: He is alert and oriented to person, place, and time.     Cranial Nerves: No cranial nerve deficit.     Deep Tendon Reflexes: Reflexes are normal and symmetric.  Psychiatric:        Behavior: Behavior normal.        Thought Content: Thought content normal.        Judgment: Judgment normal.       BP 133/71   Pulse 60   Temp 97.7 F (36.5 C) (Temporal)   Ht 5' 11 (1.803 m)   Wt 214 lb 6.4 oz (97.3 kg)   BMI 29.90 kg/m      Assessment & Plan:  [name] comes in today with chief complaint of Diabetes (BS spiked really high Thursday. It got to 216.)   Diagnosis and orders addressed:  1. Type 2 diabetes mellitus with other specified complication, without long-term current use of insulin (HCC) (Primary) Will increase Jardiance to 25 mg from 10 mg  Low carb  Follow up in 2 months  - empagliflozin (JARDIANCE) 25 MG TABS tablet; Take 1 tablet (25 mg total) by mouth daily.  Dispense: 90 tablet; Refill: 3 - BMP8+EGFR  2. Hypertension, essential At goal  - BMP8+EGFR   Labs pending Continue current medications   Health Maintenance reviewed Diet and exercise encouraged  Return in about 2 months (around 09/24/2024), or if symptoms worsen or fail to improve.    Bari Learn, FNP

## 2024-07-25 NOTE — Patient Instructions (Signed)
 Hypertension, Adult High blood pressure (hypertension) is when the force of blood pumping through the arteries is too strong. The arteries are the blood vessels that carry blood from the heart throughout the body. Hypertension forces the heart to work harder to pump blood and may cause arteries to become narrow or stiff. Untreated or uncontrolled hypertension can lead to a heart attack, heart failure, a stroke, kidney disease, and other problems. A blood pressure reading consists of a higher number over a lower number. Ideally, your blood pressure should be below 120/80. The first ("top") number is called the systolic pressure. It is a measure of the pressure in your arteries as your heart beats. The second ("bottom") number is called the diastolic pressure. It is a measure of the pressure in your arteries as the heart relaxes. What are the causes? The exact cause of this condition is not known. There are some conditions that result in high blood pressure. What increases the risk? Certain factors may make you more likely to develop high blood pressure. Some of these risk factors are under your control, including: Smoking. Not getting enough exercise or physical activity. Being overweight. Having too much fat, sugar, calories, or salt (sodium) in your diet. Drinking too much alcohol. Other risk factors include: Having a personal history of heart disease, diabetes, high cholesterol, or kidney disease. Stress. Having a family history of high blood pressure and high cholesterol. Having obstructive sleep apnea. Age. The risk increases with age. What are the signs or symptoms? High blood pressure may not cause symptoms. Very high blood pressure (hypertensive crisis) may cause: Headache. Fast or irregular heartbeats (palpitations). Shortness of breath. Nosebleed. Nausea and vomiting. Vision changes. Severe chest pain, dizziness, and seizures. How is this diagnosed? This condition is diagnosed by  measuring your blood pressure while you are seated, with your arm resting on a flat surface, your legs uncrossed, and your feet flat on the floor. The cuff of the blood pressure monitor will be placed directly against the skin of your upper arm at the level of your heart. Blood pressure should be measured at least twice using the same arm. Certain conditions can cause a difference in blood pressure between your right and left arms. If you have a high blood pressure reading during one visit or you have normal blood pressure with other risk factors, you may be asked to: Return on a different day to have your blood pressure checked again. Monitor your blood pressure at home for 1 week or longer. If you are diagnosed with hypertension, you may have other blood or imaging tests to help your health care provider understand your overall risk for other conditions. How is this treated? This condition is treated by making healthy lifestyle changes, such as eating healthy foods, exercising more, and reducing your alcohol intake. You may be referred for counseling on a healthy diet and physical activity. Your health care provider may prescribe medicine if lifestyle changes are not enough to get your blood pressure under control and if: Your systolic blood pressure is above 130. Your diastolic blood pressure is above 80. Your personal target blood pressure may vary depending on your medical conditions, your age, and other factors. Follow these instructions at home: Eating and drinking  Eat a diet that is high in fiber and potassium, and low in sodium, added sugar, and fat. An example of this eating plan is called the DASH diet. DASH stands for Dietary Approaches to Stop Hypertension. To eat this way: Eat  plenty of fresh fruits and vegetables. Try to fill one half of your plate at each meal with fruits and vegetables. Eat whole grains, such as whole-wheat pasta, brown rice, or whole-grain bread. Fill about one  fourth of your plate with whole grains. Eat or drink low-fat dairy products, such as skim milk or low-fat yogurt. Avoid fatty cuts of meat, processed or cured meats, and poultry with skin. Fill about one fourth of your plate with lean proteins, such as fish, chicken without skin, beans, eggs, or tofu. Avoid pre-made and processed foods. These tend to be higher in sodium, added sugar, and fat. Reduce your daily sodium intake. Many people with hypertension should eat less than 1,500 mg of sodium a day. Do not drink alcohol if: Your health care provider tells you not to drink. You are pregnant, may be pregnant, or are planning to become pregnant. If you drink alcohol: Limit how much you have to: 0-1 drink a day for women. 0-2 drinks a day for men. Know how much alcohol is in your drink. In the [place] one drink equals one 12 oz bottle of beer (355 mL), one 5 oz glass of wine (148 mL), or one 1 oz glass of hard liquor (44 mL). Lifestyle  Work with your health care provider to maintain a healthy body weight or to lose weight. Ask what an ideal weight is for you. Get at least 30 minutes of exercise that causes your heart to beat faster (aerobic exercise) most days of the week. Activities may include walking, swimming, or biking. Include exercise to strengthen your muscles (resistance exercise), such as Pilates or lifting weights, as part of your weekly exercise routine. Try to do these types of exercises for 30 minutes at least 3 days a week. Do not use any products that contain nicotine or tobacco. These products include cigarettes, chewing tobacco, and vaping devices, such as e-cigarettes. If you need help quitting, ask your health care provider. Monitor your blood pressure at home as told by your health care provider. Keep all follow-up visits. This is important. Medicines Take over-the-counter and prescription medicines only as told by your health care provider. Follow directions carefully. Blood  pressure medicines must be taken as prescribed. Do not skip doses of blood pressure medicine. Doing this puts you at risk for problems and can make the medicine less effective. Ask your health care provider about side effects or reactions to medicines that you should watch for. Contact a health care provider if you: Think you are having a reaction to a medicine you are taking. Have headaches that keep coming back (recurring). Feel dizzy. Have swelling in your ankles. Have trouble with your vision. Get help right away if you: Develop a severe headache or confusion. Have unusual weakness or numbness. Feel faint. Have severe pain in your chest or abdomen. Vomit repeatedly. Have trouble breathing. These symptoms may be an emergency. Get help right away. Call 911. Do not wait to see if the symptoms will go away. Do not drive yourself to the hospital. Summary Hypertension is when the force of blood pumping through your arteries is too strong. If this condition is not controlled, it may put you at risk for serious complications. Your personal target blood pressure may vary depending on your medical conditions, your age, and other factors. For most people, a normal blood pressure is less than 120/80. Hypertension is treated with lifestyle changes, medicines, or a combination of both. Lifestyle changes include losing weight, eating a healthy,  low-sodium diet, exercising more, and limiting alcohol. This information is not intended to replace advice given to you by your health care provider. Make sure you discuss any questions you have with your health care provider. Document Revised: 07/09/2021 Document Reviewed: 07/09/2021 Elsevier Patient Education  2024 [employer].

## 2024-07-26 ENCOUNTER — Ambulatory Visit: Payer: Self-pay | Admitting: Family

## 2024-07-27 ENCOUNTER — Ambulatory Visit: Admitting: Adult Health

## 2024-07-27 ENCOUNTER — Encounter: Payer: Self-pay | Admitting: Adult Health

## 2024-07-27 DIAGNOSIS — Z87891 Personal history of nicotine dependence: Secondary | ICD-10-CM

## 2024-07-27 NOTE — Progress Notes (Signed)
  Virtual Visit via Telephone Note  I connected with Keith Zimmerman , 07/27/24 9:54 AM by a telemedicine application and verified that I am speaking with the correct person using two identifiers.  Location: Patient: home Provider: home   I discussed the limitations of evaluation and management by telemedicine and the availability of in person appointments. The patient expressed understanding and agreed to proceed.   Shared Decision Making Visit Lung Cancer Screening Program [phone])   Eligibility: 63 y.o. Pack Years Smoking History Calculation = 20 pack years  (# packs/per year x # years smoked) Recent History of coughing up blood  no Unexplained weight loss? no ( >Than 15 pounds within the last 6 months ) Prior History Lung / other cancer no (Diagnosis within the last 5 years already requiring surveillance chest CT Scans). Smoking Status Former Smoker Former Smokers: Years since quit: 3 years  Quit Date: 04/2021  Visit Components: Discussion included one or more decision making aids. YES Discussion included risk/benefits of screening. YES Discussion included potential follow up diagnostic testing for abnormal scans. YES Discussion included meaning and risk of over diagnosis. YES Discussion included meaning and risk of False Positives. YES Discussion included meaning of total radiation exposure. YES  Counseling Included: Importance of adherence to annual lung cancer LDCT screening. YES Impact of comorbidities on ability to participate in the program. YES Ability and willingness to under diagnostic treatment. YES  Smoking Cessation Counseling: Former Smokers:  Discussed the importance of maintaining cigarette abstinence. yes Diagnosis Code: Personal History of Nicotine Dependence. S12.108 Information about tobacco cessation classes and interventions provided to patient. Yes Patient provided with ticket for LDCT Scan. yes Written Order for Lung Cancer Screening with LDCT  placed in Epic. Yes (CT Chest Lung Cancer Screening Low Dose W/O CM) [id number]   Z12.2-Screening of respiratory organs Z87.891-Personal history of nicotine dependence   [name] 07/27/24

## 2024-07-27 NOTE — Patient Instructions (Signed)

## 2024-08-01 ENCOUNTER — Ambulatory Visit
Admission: RE | Admit: 2024-08-01 | Discharge: 2024-08-01 | Disposition: A | Source: Ambulatory Visit | Attending: Acute Care | Admitting: Acute Care

## 2024-08-01 DIAGNOSIS — Z87891 Personal history of nicotine dependence: Secondary | ICD-10-CM

## 2024-08-01 DIAGNOSIS — Z122 Encounter for screening for malignant neoplasm of respiratory organs: Secondary | ICD-10-CM

## 2024-08-05 ENCOUNTER — Ambulatory Visit: Admitting: Nurse Practitioner

## 2024-08-05 ENCOUNTER — Encounter: Payer: Self-pay | Admitting: Nurse Practitioner

## 2024-08-05 ENCOUNTER — Ambulatory Visit: Payer: Self-pay

## 2024-08-05 VITALS — BP 118/65 | HR 65 | Temp 97.8°F | Ht 71.0 in | Wt 213.0 lb

## 2024-08-05 DIAGNOSIS — I10 Essential (primary) hypertension: Secondary | ICD-10-CM | POA: Diagnosis not present

## 2024-08-05 DIAGNOSIS — M542 Cervicalgia: Secondary | ICD-10-CM | POA: Diagnosis not present

## 2024-08-05 MED ORDER — PREDNISONE 20 MG PO TABS: 40.0000 mg | ORAL_TABLET | Freq: Every day | ORAL | 0 refills | Status: AC

## 2024-08-05 NOTE — Telephone Encounter (Signed)
 FYI Only or Action Required?: FYI only for provider: appointment scheduled on 08/05/2024 at 9:35am with the Doctor of the Day at PCP office.  Patient was last seen in primary care on 07/25/2024 by Keith Bari LABOR, FNP.  Called Nurse Triage reporting Hypertension.  Symptoms began a year ago but worse in the past two weeks per patient.  Interventions attempted: Prescription medications: bp medications--changed by cardiologist and Rest, hydration, or home remedies.  Symptoms are: unchanged.  Triage Disposition: See PCP Within 2 Weeks  Patient/caregiver understands and will follow disposition?: Yes                Copied from CRM #[id number]. Topic: Clinical - Red Word Triage >> Aug 05, 2024  7:44 AM Rosaria BRAVO wrote: Red Word that prompted transfer to Nurse Triage: Blurry vision, stressed, high blood pressure, neck swelling,   Reading today: 153/81 just a few minutes ago. Reason for Disposition  [1] Systolic BP >= 130 OR Diastolic >= 80 AND [2] taking BP medications  Answer Assessment - Initial Assessment Questions Santina to see a Cardiologist 07/07/2024 ---put on Amlodipine  & a fluid pill but pt stopped taking that bc it was making him constipated  Patient states blood pressure has been in the 150s despite medication adjustments  Patient denies blurry vision, chest pain, difficulty breathing, neck swelling, headache at this time during triage  Blood sugars have been good according to patient--all below 100   Patient is advised to call us  back if anything changes or with any further questions/concerns. Patient is advised that if anything worsens to go to the Emergency Room or call 911. Patient verbalized understanding.     1. BLOOD PRESSURE: What is your blood pressure? Did you take at least two measurements 5 minutes apart?     153/81 pulse 70 bpm         156/77 pulse 65 bpm 2. ONSET: When did you take your blood pressure?     Just before triage  3. HOW:  How did you take your blood pressure? (e.g., automatic home BP monitor, visiting nurse)     Home bp cuff 4. HISTORY: Do you have a history of high blood pressure?     Yes---was on a bp med 5. MEDICINES: Are you taking any medicines for blood pressure? Have you missed any doses recently?     ---- 6. OTHER SYMPTOMS: Do you have any symptoms? (e.g., blurred vision, chest pain, difficulty breathing, headache, weakness)     Blurred vision at times but denies at this time, feels like high altitude, neck hurt yesterday---neck swelling in the past they thought it was lymph nodes and saliva glands ended up being the issue  Protocols used: Blood Pressure - High-A-AH

## 2024-08-05 NOTE — Progress Notes (Signed)
   Subjective:    Patient ID: Keith Zimmerman, male    DOB: 1961/07/03, 63 y.o.   MRN: [id number]   Chief Complaint: Hypertension and Neck Pain   HPI  Patient ib today with 2 complaints - neck pain- started 2 days ago. Denies injury. Intermittent. Worsens throughout the day. Describes pain achy pain. Standing increases pain. Laying down helps. Has taken aspirin  with no relief.  - hypertension- he just recently bought and blood pressure cuff and it has been running around 150' systolic in mornings. Usually right before taking meds. Denies headache, sob or chest pain.  Patient Active Problem List   Diagnosis Date Noted   Obesity (BMI 30-39.9) 07/11/2024   Umbilical hernia without obstruction and without gangrene 07/11/2024   Hypertension, essential 03/30/2023   Diabetes mellitus (HCC) 09/17/2022   Mixed dyslipidemia 12/29/2017   Former smoker 12/29/2017       Review of Systems  All other systems reviewed and are negative.      Objective:   Physical Exam Constitutional:      Appearance: Normal appearance.  Cardiovascular:     Rate and Rhythm: Regular rhythm.     Heart sounds: Normal heart sounds.  Pulmonary:     Effort: Pulmonary effort is normal.     Breath sounds: Normal breath sounds.  Musculoskeletal:     Comments: FROM of cervical spine with pain on rotation to right and tilting in any direction  Skin:    General: Skin is warm.  Neurological:     General: No focal deficit present.     Mental Status: He is alert and oriented to person, place, and time.  Psychiatric:        Mood and Affect: Mood normal.        Behavior: Behavior normal.     BP 118/65 Comment: Right arm  Pulse 65   Temp 97.8 F (36.6 C) (Temporal)   Ht 5' 11 (1.803 m)   Wt 213 lb (96.6 kg)   SpO2 94%   BMI 29.71 kg/m        Assessment & Plan:   MARTAVION COUPER in today with chief complaint of Hypertension and Neck Pain   1. Neck pain (Primary) Moist heat Stretches RTO  prn - predniSONE  (DELTASONE ) 20 MG tablet; Take 2 tablets (40 mg total) by mouth daily with breakfast for 5 days. 2 po daily for 5 days  Dispense: 10 tablet; Refill: 0  2. Primary hypertension Take amlodipine  at same time daily Dash diet Check blood pressure randomly throughout the day.    The above assessment and management plan was discussed with the patient. The patient verbalized understanding of and has agreed to the management plan. Patient is aware to call the clinic if symptoms persist or worsen. Patient is aware when to return to the clinic for a follow-up visit. Patient educated on when it is appropriate to go to the emergency department.   Mary-Margaret Gladis, FNP

## 2024-08-05 NOTE — Telephone Encounter (Signed)
 Appt made.

## 2024-08-05 NOTE — Patient Instructions (Signed)
 Cervical Sprain A cervical sprain is a stretch or tear in one or more of the ligaments in the neck. Ligaments are the tissues that connect bones to each other. Cervical sprains can range from mild to severe. Severe cervical sprains can cause the spinal bones (vertebrae) in the neck to be unstable. This can result in spinal cord damage and serious nervous system problems. Healing time for a cervical sprain depends on the cause and extent of the injury. Most cervical sprains heal in 4-6 weeks. What are the causes? Cervical sprains may be caused by trauma, such as an injury from a motor vehicle accident, a fall, or a sudden forward and backward whipping movement of the head and neck (whiplash injury). Mild cervical sprains may be caused by wear and tear over time. What increases the risk? You are more likely to get a cervical sprain if: You take part in activities that have a high risk of trauma to the neck. These include contact sports, gymnastics, and diving. You have: Osteoarthritis of the spine. Poor strength and flexibility of the neck. Poor posture. You have had a neck injury in the past. You spend long periods in positions that put stress on the neck, such as sitting at a computer. What are the signs or symptoms? Symptoms of this condition include: Any of these problems in the neck, shoulders, or upper back: Pain or tenderness. Stiffness. Swelling. A burning feeling. Sudden tightening of neck muscles (spasms). Limited ability to move the neck. Headache. Dizziness. Nausea or vomiting. Weakness, numbness, or tingling in a hand or an arm. Symptoms may develop right away after injury or may develop over a few days. In some cases, symptoms may go away with treatment and return (recur) over time. How is this diagnosed? This condition may be diagnosed based on: Your symptoms, medical history, and a physical exam. Any recent injuries or known neck problems that you have, such as arthritis  in the neck. Imaging tests, such as X-rays, an MRI, or a CT scan. How is this treated? This condition is treated by resting and icing the injured area and doing physical therapy exercises to improve movement and strength. Heat therapy may be used 2-3 days after the injury if there is no swelling. Depending on the severity of your condition, treatment may also include: Keeping your neck in place (immobilized) for periods of time. This may be done using: A cervical collar. This supports your chin and the back of your head. A cervical traction device. This is a sling that holds up your head. It removes weight and pressure from your neck. Medicines for pain or other symptoms. Surgery. This is rare. Follow these instructions at home: Medicines Take over-the-counter and prescription medicines only as told by your health care provider. Ask your provider if the medicine prescribed to you: Requires you to avoid driving or using machinery. Can cause constipation. You may need to take these actions to prevent or treat constipation: Drink enough fluid to keep your pee pale yellow. Take over-the-counter or prescription medicines. Eat foods that are high in fiber, such as beans, whole grains, and fresh fruits and vegetables. Limit foods that are high in fat and processed sugars, such as fried or sweet foods. If you have a cervical collar: Wear the collar as told by your provider. Do not remove it unless told. Ask before making any adjustments to your collar. If you have long hair, keep it outside of the collar. If you are allowed to remove the  collar for cleaning and bathing: Follow instructions about how to remove it safely. Clean it by hand with mild soap and water and air-dry it completely. If your collar has removable pads, remove them every 1-2 days and wash them by hand with soap and water. Let them air-dry completely before putting them back in the collar. Tell your provider if your skin under  the collar has irritation or sores. Managing pain, stiffness, and swelling     Use a cervical traction device as told. If told, put ice on the affected area. Put ice in a plastic bag. Place a towel between your skin and the bag. Leave the ice on for 20 minutes, 2-3 times a day. If told, apply heat to the affected area before you exercise or as often as told by your provider. Use the heat source that your provider recommends, such as a moist heat pack or a heating pad. Place a towel between your skin and the heat source. Leave the heat on for 20-30 minutes. If your skin turns bright red, remove the ice or heat right away to prevent skin damage. The risk of damage is higher if you cannot feel pain, heat, or cold. Activity Do not drive while wearing a cervical collar. If you do not have a cervical collar, ask if it is safe to drive while your neck heals. Do not lift anything that is heavier than 10 lb (4.5 kg) until your provider says that it is safe. Rest as told by your provider. Avoid positions and activities that make your symptoms worse. Do physical therapy exercises as told by your provider or physical therapist. Return to your normal activities as told by your provider. Ask your provider what activities are safe for you. General instructions Do not use any products that contain nicotine or tobacco. These products include cigarettes, chewing tobacco, and vaping devices, such as e-cigarettes. These can delay healing. If you need help quitting, ask your provider. Keep all follow-up visits. Your provider will monitor your injury and activity level. How is this prevented? To prevent a cervical sprain from happening again: Use and maintain good posture. Make any needed adjustments to your workstation to help you do this. Exercise regularly as told by your provider or physical therapist. Avoid risky activities that may cause a cervical sprain. Contact a health care provider if: You have  symptoms that get worse or do not get better after 2 weeks of treatment. You have new symptoms. Your pain gets worse or does not get better with medicine. You have sores or irritated skin on your neck from wearing your cervical collar. Get help right away if: You have severe pain. You develop numbness, tingling, or weakness in any part of your body. You cannot move a part of your body (you have paralysis). You have neck pain along with severe dizziness or headache. This information is not intended to replace advice given to you by your health care provider. Make sure you discuss any questions you have with your health care provider. Document Revised: 04/04/2022 Document Reviewed: 04/04/2022 Elsevier Patient Education  2024 [employer].

## 2024-08-08 ENCOUNTER — Other Ambulatory Visit: Payer: Self-pay | Admitting: Acute Care

## 2024-08-08 DIAGNOSIS — Z87891 Personal history of nicotine dependence: Secondary | ICD-10-CM

## 2024-08-08 DIAGNOSIS — Z122 Encounter for screening for malignant neoplasm of respiratory organs: Secondary | ICD-10-CM

## 2024-08-22 ENCOUNTER — Ambulatory Visit: Payer: Self-pay | Admitting: Surgery

## 2024-08-22 NOTE — H&P (Signed)
 Subjective    Chief Complaint: New Consultation (Umbilical hernia)       History of Present Illness: Keith Zimmerman is a 63 y.o. male who is seen today as an office consultation at the request of Dr. Lavell for evaluation of New Consultation (Umbilical hernia) .     This is a 63 year old male with type 2 diabetes and hypertension who presents with a 2-1/2-year history of bulging on his umbilicus.  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  Subsequently he developed a small bulge in this area.  It causes some mild discomfort.  It has not really enlarged.  He brought this to the attention of his PCP who referred him to us  for evaluation.  He denies any GI obstructive symptoms.  His job does not require a lot of heavy lifting but he does need to crawl into tight spaces.  No previous abdominal surgery.     Review of Systems: A complete review of systems was obtained from the patient.  I have reviewed this information and discussed as appropriate with the patient.  See HPI as well for other ROS.   Review of Systems  Constitutional: Negative.   HENT: Negative.    Eyes: Negative.   Respiratory: Negative.    Cardiovascular: Negative.   Gastrointestinal: Negative.   Genitourinary: Negative.   Musculoskeletal: Negative.   Skin: Negative.   Neurological: Negative.   Endo/Heme/Allergies: Negative.   Psychiatric/Behavioral: Negative.          Medical History: Past Medical History  History reviewed. No pertinent past medical history.     Problem List     Patient Active Problem List  Diagnosis   Diabetes mellitus (CMS/HHS-HCC)   Former smoker   Hypertension, essential   Mixed dyslipidemia   Obesity (BMI 30-39.9), unspecified        Past Surgical History  History reviewed. No pertinent surgical history.      Allergies       Allergies  Allergen Reactions   Losartan  Swelling      Throat swelling        Medications Ordered Prior to  Encounter        Current Outpatient Medications on File Prior to Visit  Medication Sig Dispense Refill   amLODIPine  (NORVASC ) 5 MG tablet         atorvastatin  (LIPITOR) 20 MG tablet Take 20 mg by mouth once daily       JARDIANCE  10 mg tablet          No current facility-administered medications on file prior to visit.        Family History  History reviewed. No pertinent family history.      Tobacco Use History  Social History       Tobacco Use  Smoking Status Never  Smokeless Tobacco Never        Social History  Social History        Socioeconomic History   Marital status: Married  Tobacco Use   Smoking status: Never   Smokeless tobacco: Never  Vaping Use   Vaping status: Unknown  Substance and Sexual Activity   Alcohol use: Never   Drug use: Never    Social Drivers of Acupuncturist Strain: Low Risk  (07/24/2024)    Received from [facility]    Overall Financial Resource Strain (CARDIA)     How hard is it for you  to pay for the very basics like food, housing, medical care, and heating?: Not hard at all  Food Insecurity: No Food Insecurity (07/24/2024)    Received from [facility]    Hunger Vital Sign     Within the past 12 months, you worried that your food would run out before you got the money to buy more.: Never true     Within the past 12 months, the food you bought just didn't last and you didn't have money to get more.: Never true  Transportation Needs: No Transportation Needs (07/24/2024)    Received from [facility] - Transportation     In the past 12 months, has lack of transportation kept you from medical appointments or from getting medications?: No     In the past 12 months, has lack of transportation kept you from meetings, work, or from getting things needed for daily living?: No  Physical Activity: Unknown (07/24/2024)    Received from [facility]    Exercise Vital Sign     On average, how many days per week do you  engage in moderate to strenuous exercise (like a brisk walk)?: 5 days  Stress: No Stress Concern Present (07/24/2024)    Received from [facility] - Occupational Stress Questionnaire     Do you feel stress - tense, restless, nervous, or anxious, or unable to sleep at night because your mind is troubled all the time - these days?: Not at all  Social Connections: Moderately Integrated (07/24/2024)    Received from [facility]    Social Connection and Isolation Panel     In a typical week, how many times do you talk on the phone with family, friends, or neighbors?: More than three times a week     How often do you get together with friends or relatives?: Three times a week     How often do you attend church or religious services?: 1 to 4 times per year     Do you belong to any clubs or organizations such as church groups, unions, fraternal or athletic groups, or school groups?: No     Are you married, widowed, divorced, separated, never married, or living with a partner?: Married  Housing Stability: Unknown (08/22/2024)    Housing Stability Vital Sign     Homeless in the Last Year: No        Objective:         Vitals:    08/22/24 1012  BP: (!) 148/79  Pulse: 95  Resp: 16  Temp: 36.8 C (98.2 F)  SpO2: 98%  Weight: 98.3 kg (216 lb 12.8 oz)  Height: 180.3 cm (5' 11)    Body mass index is 30.24 kg/m.   Physical Exam    Constitutional:  WDWN in NAD, conversant, no obvious deformities; lying in bed comfortably Eyes:  Pupils equal, round; sclera anicteric; moist conjunctiva; no lid lag HENT:  Oral mucosa moist; good dentition  Neck:  No masses palpated, trachea midline; no thyromegaly Lungs:  CTA bilaterally; normal respiratory effort Breasts:  symmetric, no nipple changes; no palpable masses or lymphadenopathy on either side CV:  Regular rate and rhythm; no murmurs; extremities well-perfused with no edema Abd:  +bowel sounds, soft,  non-tender, no palpable organomegaly; no palpable hernias Musc:  Unable to assess gait; no apparent clubbing or cyanosis in extremities Lymphatic:  No palpable cervical or axillary lymphadenopathy Skin:  Warm, dry;  no sign of jaundice Psychiatric - alert and oriented x 4; calm mood and affect     Assessment and Plan:  Diagnoses and all orders for this visit:   Umbilical hernia without obstruction or gangrene       Recommend umbilical hernia repair with mesh.The surgical procedure has been discussed with the patient.  Potential risks, benefits, alternative treatments, and expected outcomes have been explained.  All of the patient's questions at this time have been answered.  The likelihood of reaching the patient's treatment goal is good.  The patient understands the proposed surgical procedure and wishes to proceed.       Keith Steinfeldt DEWAYNE LIMA, MD  08/22/2024 10:31 AM

## 2024-09-30 ENCOUNTER — Other Ambulatory Visit (HOSPITAL_COMMUNITY): Payer: Self-pay

## 2024-09-30 ENCOUNTER — Ambulatory Visit: Attending: Cardiology | Admitting: Cardiology

## 2024-09-30 ENCOUNTER — Encounter: Payer: Self-pay | Admitting: Cardiology

## 2024-09-30 VITALS — BP 138/70 | HR 69 | Ht 71.0 in | Wt 214.2 lb

## 2024-09-30 DIAGNOSIS — E782 Mixed hyperlipidemia: Secondary | ICD-10-CM

## 2024-09-30 DIAGNOSIS — E119 Type 2 diabetes mellitus without complications: Secondary | ICD-10-CM | POA: Diagnosis not present

## 2024-09-30 DIAGNOSIS — I10 Essential (primary) hypertension: Secondary | ICD-10-CM | POA: Diagnosis not present

## 2024-09-30 MED ORDER — AMLODIPINE BESYLATE 5 MG PO TABS
5.0000 mg | ORAL_TABLET | Freq: Every day | ORAL | 3 refills | Status: AC
  Filled 2024-09-30: qty 90, 90d supply, fill #0

## 2024-09-30 NOTE — Progress Notes (Signed)
 " Cardiology Office Note:  .   Date:  09/30/2024  ID:  Keith Zimmerman, DOB [birth year], MRN [id number] PCP: Lavell Bari LABOR, FNP  [facility] Providers Cardiologist:  None   History of Present Illness: .   Keith Zimmerman is a 64 y.o.  male with hypertension, mixed hypercholesterolemia and diabetes who presents with throat swelling on losartan  and high blood pressure. Hypertension has become high and uncontrolled over the last few months.   While on Farxiga  with about a 25-30 Lbs weight loss and excellent BP control. Due to cost stopped using this and has gained the weight back.  I had seen him on 07/07/2024 and added amlodipine  5 mg daily and Maxide 37.5/25 mg daily and restarted Jardiance , underwent labs and presents for follow-up.  Discontinued his Losartan  due to angioedema.    Discussed the use of AI scribe software for clinical note transcription with the patient, who gave verbal consent to proceed.  History of Present Illness Keith Zimmerman is a 64 year old male with hypertension and hyperlipidemia who presents for a cardiovascular follow-up.  He has stopped losartan  due to angioedema which is stopped on his last office visit and is taking amlodipine  5 mg daily, Lipitor 20 mg daily, Jardiance  25 mg daily, and an over-the-counter fish oil capsule. He occasionally takes a multivitamin.  He has lost 5 to 8 pounds since his last visit. He is scheduled for umbilical hernia surgery.  He works in a [employer] and is active on his feet most of the day. He has no chest pain or shortness of breath.  He has reduced carbohydrates and cheese intake in response to high triglycerides and is mindful of starch intake.  Cardiac Studies relevent.       EKG:      Labs   Lab Results  Component Value Date   CHOL 228 (H) 07/11/2024   HDL 34 (L) 07/11/2024   LDLCALC 123 (H) 07/11/2024   TRIG 400 (H) 07/11/2024   CHOLHDL 6.7 (H) 07/11/2024   No results found for:  LIPOA  Recent Labs    12/02/23 0820 12/02/23 1115 07/11/24 1127 07/25/24 0830  NA 140  --  136 141  K 4.1  --  3.9 4.4  CL 105  --  98 105  CO2 19*  --  20 19*  GLUCOSE 112*  --  273* 117*  BUN 29*  --  32* 27  CREATININE 0.98 1.00 1.16 1.03  CALCIUM  9.5  --  9.5 9.3    Lab Results  Component Value Date   ALT 36 07/11/2024   AST 18 07/11/2024   ALKPHOS 121 07/11/2024   BILITOT 1.0 07/11/2024      Latest Ref Rng & Units 07/11/2024   11:27 AM 12/02/2023    8:20 AM 11/18/2023    4:20 PM  CBC  WBC 3.4 - 10.8 x10E3/uL 8.6  8.2  7.7   Hemoglobin 13.0 - 17.7 g/dL 84.4  85.0  85.4   Hematocrit 37.5 - 51.0 % 48.0  44.7  42.8   Platelets 150 - 450 x10E3/uL 200  184  179    Lab Results  Component Value Date   HGBA1C 7.0 (H) 07/11/2024    Lab Results  Component Value Date   TSH 2.000 07/11/2024     ROS  Review of Systems  Cardiovascular:  Negative for chest pain, dyspnea on exertion and leg swelling.   Physical Exam:   VS:  BP 138/70  Pulse 69   Ht 5' 11 (1.803 m)   Wt 214 lb 3.2 oz (97.2 kg)   SpO2 99%   BMI 29.87 kg/m    Wt Readings from Last 3 Encounters:  09/30/24 214 lb 3.2 oz (97.2 kg)  08/05/24 213 lb (96.6 kg)  07/25/24 214 lb 6.4 oz (97.3 kg)    BP Readings from Last 3 Encounters:  09/30/24 138/70  08/05/24 118/65  07/25/24 133/71   Physical Exam Neck:     Vascular: No carotid bruit or JVD.  Cardiovascular:     Rate and Rhythm: Normal rate and regular rhythm.     Pulses: Intact distal pulses.     Heart sounds: Normal heart sounds. No murmur heard.    No gallop.  Pulmonary:     Effort: Pulmonary effort is normal.     Breath sounds: Normal breath sounds.  Abdominal:     General: Bowel sounds are normal.     Palpations: Abdomen is soft.  Musculoskeletal:     Right lower leg: No edema.     Left lower leg: No edema.     ASSESSMENT AND PLAN: .      ICD-10-CM   1. Mixed dyslipidemia  E78.2     2. Hypertension, essential  I10 amLODipine   (NORVASC ) 5 MG tablet    3. Type 2 diabetes mellitus without complication, without long-term current use of insulin (HCC)  E11.9      Assessment & Plan Essential hypertension Blood pressure is well controlled with amlodipine  5 mg once daily. - Continue amlodipine  5 mg once daily.  Mixed dyslipidemia Cholesterol levels have not been checked recently. He is on Lipitor 20 mg once daily. Advised to reduce carbohydrate intake due to previously high triglycerides. - Checked cholesterol levels today. - Continue Lipitor 20 mg once daily. - Advised to reduce carbohydrate intake, including starches and cheese to improve triglyceride levels, if triglycerides remain high, consider addition of Lovaza 2 g twice daily.  Type 2 diabetes mellitus Diabetes is managed with Jardiance , which he is tolerating well. - Continue Jardiance  as prescribed. - Consider GLP-1 agonist if blood sugar is not controlled.  Follow up: As needed.  Although he has cardiovascular is factors, primary prevention cardiology and asymptomatic individual is most appropriate.  Patient to follow-up with PCP for further management and refills.  Signed,  Gordy Bergamo, MD, [facility] 09/30/2024, 8:45 AM [facility] [address] [place], [place] [address] Phone: [phone]. Fax:  [phone]  "

## 2024-09-30 NOTE — Patient Instructions (Signed)
 Medication Instructions:  START  amLODipine  (NORVASC ) 5 MG tablet         Take 1 tablet (5 mg total) by mouth dail   *If you need a refill on your cardiac medications before your next appointment, please call your pharmacy*   Follow-Up: At [facility], you and your health needs are our priority.  As part of our continuing mission to provide you with exceptional heart care, our providers are all part of one team.  This team includes your primary Cardiologist (physician) and Advanced Practice Providers or APPs (Physician Assistants and Nurse Practitioners) who all work together to provide you with the care you need, when you need it.  Your next appointment:    As Needed  Provider:   Gordy Bergamo, MD        We recommend signing up for the patient portal called MyChart.  Patients are able to view lab/test results, encounter notes, upcoming appointments, etc.  Non-urgent messages can be sent to your provider as well, go to [email or web address].

## 2024-10-01 ENCOUNTER — Encounter: Payer: Self-pay | Admitting: Cardiology

## 2024-10-10 ENCOUNTER — Ambulatory Visit: Admitting: Family

## 2024-11-07 ENCOUNTER — Ambulatory Visit: Admitting: Family
